# Patient Record
Sex: Female | Born: 1980 | Hispanic: Yes | Marital: Married | Smoking: Never smoker
Health system: Southern US, Community
[De-identification: ages and names within clinical notes are randomized; demographics above are authoritative.]

## PROBLEM LIST (undated history)

## (undated) ENCOUNTER — Inpatient Hospital Stay (HOSPITAL_COMMUNITY): Payer: Self-pay

## (undated) DIAGNOSIS — E119 Type 2 diabetes mellitus without complications: Secondary | ICD-10-CM

## (undated) DIAGNOSIS — Z9049 Acquired absence of other specified parts of digestive tract: Secondary | ICD-10-CM

## (undated) DIAGNOSIS — O24419 Gestational diabetes mellitus in pregnancy, unspecified control: Secondary | ICD-10-CM

## (undated) DIAGNOSIS — N83209 Unspecified ovarian cyst, unspecified side: Secondary | ICD-10-CM

## (undated) DIAGNOSIS — D649 Anemia, unspecified: Secondary | ICD-10-CM

## (undated) HISTORY — PX: CHOLECYSTECTOMY: SHX55

## (undated) HISTORY — DX: Type 2 diabetes mellitus without complications: E11.9

## (undated) HISTORY — DX: Acquired absence of other specified parts of digestive tract: Z90.49

## (undated) HISTORY — DX: Unspecified ovarian cyst, unspecified side: N83.209

## (undated) HISTORY — DX: Anemia, unspecified: D64.9

---

## 2013-08-08 DIAGNOSIS — Z9049 Acquired absence of other specified parts of digestive tract: Secondary | ICD-10-CM

## 2013-08-08 HISTORY — DX: Acquired absence of other specified parts of digestive tract: Z90.49

## 2013-08-08 HISTORY — PX: OVARIAN CYST REMOVAL: SHX89

## 2014-10-08 LAB — CYTOLOGY - PAP: PAP SMEAR: NEGATIVE

## 2016-08-08 NOTE — L&D Delivery Note (Signed)
Patient is 36 y.o. [id number] [redacted]w[redacted]d admitted IOL GDMA2 on glyburide. S/p IOL with cytotec, followed by Pitocin. AROM at 12:30.  Prenatal course also complicated by pelvis proven to 9.5#.  Delivery Note At 5:59 PM a viable female was delivered via Vaginal, Spontaneous Delivery (Presentation: ROA).  APGAR: 9/9; weight pending.   Placenta status: intact with 3 vessels.  Cord: loose body easily reduced.  Cord pH: N/A.  Anesthesia:  Epidural Episiotomy:  N/A Lacerations:  Superficial B/L periurethral lacerations, hemostatic Est. Blood Loss (mL):  200  Mom to postpartum.  Baby to Couplet care / Skin to Skin.  Upon arrival patient was complete and pushing. She pushed with good maternal effort to deliver a viable female infant in cephalic, ROA position. Loose body nuchal cord, easily reduced. Baby delivered without difficulty (anterior shoulder delivered with ease), was noted to have good tone and place on maternal abdomen for oral suctioning, drying and stimulation. Delayed cord clamping performed. Placenta delivered spontaneously with gentle cord traction. Fundus firm with massage and Pitocin. Perineum inspected and found to have superficial B/L periurethral laceration, which were found to be hemostatic and not requiring repair. Counts of sharps, instruments, and lap pads were all correct.  Zerita Boers, CNM, supervised delivery and repair.  Burna Cash, MD Family Medicine Resident, PGY-3 04/29/2017 6:30 PM

## 2016-10-24 LAB — OB RESULTS CONSOLE PLATELET COUNT: PLATELETS: 302

## 2016-10-24 LAB — OB RESULTS CONSOLE RUBELLA ANTIBODY, IGM: Rubella: IMMUNE

## 2016-10-24 LAB — OB RESULTS CONSOLE HGB/HCT, BLOOD
HCT: 39
HCT: 39
HEMOGLOBIN: 13.2

## 2016-10-24 LAB — OB RESULTS CONSOLE GC/CHLAMYDIA
CHLAMYDIA, DNA PROBE: NEGATIVE
Gonorrhea: NEGATIVE

## 2016-10-24 LAB — OB RESULTS CONSOLE HIV ANTIBODY (ROUTINE TESTING): HIV: NONREACTIVE

## 2016-10-24 LAB — OB RESULTS CONSOLE RPR: RPR: NONREACTIVE

## 2016-10-24 LAB — OB RESULTS CONSOLE HEPATITIS B SURFACE ANTIGEN: HEP B S AG: NEGATIVE

## 2016-10-24 LAB — OB RESULTS CONSOLE ABO/RH

## 2016-10-24 LAB — OB RESULTS CONSOLE VARICELLA ZOSTER ANTIBODY, IGG: VARICELLA IGG: IMMUNE

## 2016-10-24 LAB — OB RESULTS CONSOLE ANTIBODY SCREEN: Antibody Screen: POSITIVE

## 2016-10-24 LAB — GLUCOSE, 1 HOUR: GLUCOSE 1 HOUR GTT: 97 (ref ?–200)

## 2017-02-16 ENCOUNTER — Ambulatory Visit: Payer: Self-pay | Admitting: *Deleted

## 2017-02-16 ENCOUNTER — Encounter: Payer: Self-pay | Attending: Obstetrics and Gynecology | Admitting: *Deleted

## 2017-02-16 ENCOUNTER — Other Ambulatory Visit: Payer: Self-pay

## 2017-02-16 DIAGNOSIS — O24410 Gestational diabetes mellitus in pregnancy, diet controlled: Secondary | ICD-10-CM

## 2017-02-16 DIAGNOSIS — Z3A00 Weeks of gestation of pregnancy not specified: Secondary | ICD-10-CM | POA: Insufficient documentation

## 2017-02-16 NOTE — Progress Notes (Signed)
  Patient was seen on 02/16/2017 for Gestational Diabetes self-management.  Patient here with Spanish Interpretor. She states no history of GDM with previous pregnancies. She says her husband has Diabetes though. Diet history obtained.  The following learning objectives were met by the patient :   States the definition of Gestational Diabetes  States why dietary management is important in controlling blood glucose  Describes the effects of carbohydrates on blood glucose levels  Demonstrates ability to create a balanced meal plan  Demonstrates carbohydrate counting   States when to check blood glucose levels  Demonstrates proper blood glucose monitoring techniques  States the effect of stress and exercise on blood glucose levels  States the importance of limiting caffeine and abstaining from alcohol and smoking  Plan:  Aim for 3 Carb Choices per meal (45 grams) +/- 1 either way  Aim for 1-2 Carbs per snack Begin reading food labels for Total Carbohydrate of foods Consider  increasing your activity level by walking or other activity daily as tolerated Begin checking BG before breakfast and 2 hours after first bite of breakfast, lunch and dinner as directed by MD  Bring Log Book to every medical appointment   Take medication if directed by MD  Blood glucose monitor given: [employer] Lot # [id number] Exp: 11/26/2018 Blood glucose reading: 143 mg/dl after eating 2 Pop Tarts on way to this appointment  Patient instructed to monitor glucose levels: FBS: 60 - 95 mg/dl 2 hour: <120 mg/dl  Patient received the following handouts:  Nutrition Diabetes and Pregnancy  Carbohydrate Counting List  BG Log Sheet  Patient will be seen for follow-up as needed.

## 2017-02-20 ENCOUNTER — Encounter: Payer: Self-pay | Admitting: Obstetrics and Gynecology

## 2017-03-13 ENCOUNTER — Ambulatory Visit (INDEPENDENT_AMBULATORY_CARE_PROVIDER_SITE_OTHER): Payer: Self-pay | Admitting: Obstetrics & Gynecology

## 2017-03-13 ENCOUNTER — Encounter: Payer: Self-pay | Admitting: Obstetrics & Gynecology

## 2017-03-13 ENCOUNTER — Encounter: Payer: Self-pay | Admitting: *Deleted

## 2017-03-13 ENCOUNTER — Ambulatory Visit: Payer: Self-pay

## 2017-03-13 DIAGNOSIS — O24419 Gestational diabetes mellitus in pregnancy, unspecified control: Secondary | ICD-10-CM

## 2017-03-13 DIAGNOSIS — O0993 Supervision of high risk pregnancy, unspecified, third trimester: Secondary | ICD-10-CM

## 2017-03-13 DIAGNOSIS — O0990 Supervision of high risk pregnancy, unspecified, unspecified trimester: Secondary | ICD-10-CM | POA: Insufficient documentation

## 2017-03-13 LAB — POCT URINALYSIS DIP (DEVICE)
BILIRUBIN URINE: NEGATIVE
Glucose, UA: NEGATIVE mg/dL
Hgb urine dipstick: NEGATIVE
KETONES UR: NEGATIVE mg/dL
LEUKOCYTES UA: NEGATIVE
NITRITE: NEGATIVE
Protein, ur: NEGATIVE mg/dL
Specific Gravity, Urine: 1.015 (ref 1.005–1.030)
Urobilinogen, UA: 0.2 mg/dL (ref 0.0–1.0)
pH: 7 (ref 5.0–8.0)

## 2017-03-13 LAB — FETAL NONSTRESS TEST

## 2017-03-13 NOTE — Patient Instructions (Signed)
Diagnstico de diabetes mellitus gestacional  (Gestational Diabetes Mellitus, Diagnosis)  La diabetes gestacional (diabetes mellitus gestacional) es una forma de diabetes a corto plazo (temporal) que puede aparecer durante el embarazo. Este cuadro desaparece despus del parto. Puede deberse a uno de estos problemas o a ambos:   El cuerpo no produce la cantidad suficiente de una hormona llamada insulina.   El cuerpo no responde de forma normal a la insulina que produce.  La insulina permite que los ciertos azcares (glucosa) ingresen a las clulas del cuerpo. Esto le proporciona la energa. Cuando se tiene diabetes, la glucosa no puede ingresar a las clulas. Esto produce un aumento del nivel de glucosa en la sangre (hiperglucemia).  Si la diabetes se trata, es posible que ni usted ni el beb se vean afectados. El mdico fijar los objetivos del tratamiento para usted. Generalmente, los resultados de la glucemia deben ser los siguientes:   Despus de no comer durante mucho tiempo (ayunar): 95mg/dl (5,3mmol/l).   Despus de las comidas (posprandial):  ? Una hora despus de una comida: igual o menor que 140mg/dl (7,8mmol/l).  ? Dos horas despus de una comida: igual o menor que 120mg/dl (6,7mmol/l).   Nivel de A1c (hemoglobinaA1c): del 6% al 6,5%.  CUIDADOS EN EL HOGAR  Preguntas para hacerle al mdico  Puede hacer las siguientes preguntas:   Debo reunirme con un instructor para el cuidado de la diabetes?   Dnde puedo encontrar un grupo de apoyo para personas diabticas?   Qu equipos necesitar para cuidarme en casa?   Qu medicamentos para la diabetes necesito? Cundo debo tomarlos?   Con qu frecuencia debo controlarme el nivel de glucosa en la sangre?   A qu nmero puedo llamar si tengo preguntas?   Cundo es la prxima cita con el mdico?  Instrucciones generales   Tome los medicamentos de venta libre y los recetados solamente como se lo haya indicado el mdico.   Mantenga un peso  saludable durante el embarazo.   Concurra a todas las visitas de control como se lo haya indicado el mdico. Esto es importante.  SOLICITE AYUDA SI:   Su nivel de glucosa en la sangre es igual o superior a 240mg/dl (13,3mmol/dl).   Su nivel de glucosa en la sangre es igual o superior a 200mg/dl (11,1mmol/l), y tiene cetonas en la orina.   Ha estado enferma o ha tenido fiebre durante 2o ms das y no mejora.   Si tiene alguno de estos problemas durante ms de 6horas:  ? No puede comer ni beber.  ? Siente malestar estomacal (nuseas).  ? Vomita.  ? La materia fecal es lquida (diarrea).  SOLICITE AYUDA DE INMEDIATO SI:   El nivel de glucosa en la sangre est por debajo de 54mg/dl (3mmol/l).   Est confundida.   Tiene dificultad para hacer lo siguiente:  ? Pensar con claridad.  ? Respirar.   El beb se mueve menos de lo normal.   Tiene los siguientes sntomas:  ? Niveles moderados o altos de cetonas en la orina.  ? Hemorragia vaginal.  ? Secrecin de un lquido fuera de lo comn de la vagina.  ? Contracciones prematuras. Estas pueden causar una sensacin de opresin en el vientre.  Esta informacin no tiene como fin reemplazar el consejo del mdico. Asegrese de hacerle al mdico cualquier pregunta que tenga.  Document Released: 11/16/2015 Document Revised: 11/16/2015 Document Reviewed: 08/28/2015  Elsevier Interactive Patient Education  2018 [employer]

## 2017-03-13 NOTE — Progress Notes (Signed)
  [facility]    Yvonne Adams is a [id number] 5955w2d being seen today for her first obstetrical visit.  Her obstetrical history is significant for gestational diabetes. Patient does intend to breast feed. Pregnancy history fully reviewed.  Patient reports no complaints.  Vitals:   03/13/17 0903 03/13/17 0905  BP: 113/67   Pulse: 78   Weight: 201 lb 14.4 oz (91.6 kg)   Height:  5\' 3"  (1.6 m)    HISTORY: OB History  Gravida Para Term Preterm AB Living  7 3 3   3 3   SAB TAB Ectopic Multiple Live Births  3       3    # Outcome Date GA Lbr Len/2nd Weight Sex Delivery Anes PTL Lv  7 Current           6 Term 07/25/11 7021w0d   F  EPI N LIV  5 SAB 02/10/09        FD  4 SAB 06/13/06          3 SAB 03/13/04          2 Term 12/25/00 3021w0d   F  EPI N LIV  1 Term 07/22/99 8221w0d   F Vag-Spont Local N LIV     Past Medical History:  Diagnosis Date  . Diabetes mellitus without complication (HCC)    No past surgical history on file. No family history on file.   Exam    Uterus:     Pelvic Exam:                                    Skin: normal coloration and turgor, no rashes    Neurologic: oriented, normal mood   Extremities: normal strength, tone, and muscle mass   HEENT PERRLA and thyroid without masses   Mouth/Teeth dental hygiene good   Neck supple   Cardiovascular: regular rate and rhythm   Respiratory:  appears well, vitals normal, no respiratory distress, acyanotic, normal RR   Abdomen: gravid 32 cm FH          Assessment:    Pregnancy: [id number] Patient Active Problem List   Diagnosis Date Noted  . Supervision of high risk pregnancy, antepartum 03/13/2017  . Gestational diabetes mellitus (GDM), antepartum 03/13/2017        Plan:    FBS >100 and PP up to 140-190. Reports weight loss Initial labs drawn. Prenatal vitamins. Problem list reviewed and updated. HD records pending  Ultrasound discussed; fetal  survey: ordered.  Follow up in 1 weeks. 50% of 30 min visit spent on counseling and coordination of care.  Needs f/u with diabetes nurse or dietician   Yvonne Adams 03/13/2017

## 2017-03-16 ENCOUNTER — Encounter: Payer: Self-pay | Admitting: *Deleted

## 2017-03-21 ENCOUNTER — Other Ambulatory Visit: Payer: Self-pay | Admitting: *Deleted

## 2017-03-21 DIAGNOSIS — O24419 Gestational diabetes mellitus in pregnancy, unspecified control: Secondary | ICD-10-CM

## 2017-03-23 ENCOUNTER — Encounter (HOSPITAL_COMMUNITY): Payer: Self-pay

## 2017-03-23 ENCOUNTER — Ambulatory Visit (INDEPENDENT_AMBULATORY_CARE_PROVIDER_SITE_OTHER): Payer: Self-pay | Admitting: Obstetrics & Gynecology

## 2017-03-23 ENCOUNTER — Encounter: Payer: Self-pay | Attending: Obstetrics and Gynecology | Admitting: *Deleted

## 2017-03-23 ENCOUNTER — Ambulatory Visit (HOSPITAL_COMMUNITY): Payer: Self-pay

## 2017-03-23 ENCOUNTER — Ambulatory Visit (HOSPITAL_COMMUNITY)
Admission: RE | Admit: 2017-03-23 | Discharge: 2017-03-23 | Disposition: A | Discharge status: Home / Self Care | Payer: Self-pay | Source: Ambulatory Visit | Attending: Obstetrics & Gynecology | Admitting: Obstetrics & Gynecology

## 2017-03-23 ENCOUNTER — Ambulatory Visit (INDEPENDENT_AMBULATORY_CARE_PROVIDER_SITE_OTHER): Payer: Self-pay | Admitting: *Deleted

## 2017-03-23 ENCOUNTER — Other Ambulatory Visit (HOSPITAL_COMMUNITY): Payer: Self-pay | Admitting: *Deleted

## 2017-03-23 ENCOUNTER — Ambulatory Visit: Payer: Self-pay | Admitting: *Deleted

## 2017-03-23 VITALS — BP 125/63 | HR 83 | Wt 202.1 lb

## 2017-03-23 DIAGNOSIS — O0990 Supervision of high risk pregnancy, unspecified, unspecified trimester: Secondary | ICD-10-CM

## 2017-03-23 DIAGNOSIS — O24410 Gestational diabetes mellitus in pregnancy, diet controlled: Secondary | ICD-10-CM

## 2017-03-23 DIAGNOSIS — O24419 Gestational diabetes mellitus in pregnancy, unspecified control: Secondary | ICD-10-CM

## 2017-03-23 DIAGNOSIS — Z3A00 Weeks of gestation of pregnancy not specified: Secondary | ICD-10-CM | POA: Insufficient documentation

## 2017-03-23 DIAGNOSIS — Z3A33 33 weeks gestation of pregnancy: Secondary | ICD-10-CM | POA: Insufficient documentation

## 2017-03-23 DIAGNOSIS — Z363 Encounter for antenatal screening for malformations: Secondary | ICD-10-CM | POA: Insufficient documentation

## 2017-03-23 DIAGNOSIS — O0993 Supervision of high risk pregnancy, unspecified, third trimester: Secondary | ICD-10-CM

## 2017-03-23 HISTORY — DX: Gestational diabetes mellitus in pregnancy, unspecified control: O24.419

## 2017-03-23 LAB — POCT URINALYSIS DIP (DEVICE)
BILIRUBIN URINE: NEGATIVE
Glucose, UA: NEGATIVE mg/dL
KETONES UR: NEGATIVE mg/dL
Leukocytes, UA: NEGATIVE
Nitrite: NEGATIVE
PH: 7 (ref 5.0–8.0)
PROTEIN: NEGATIVE mg/dL
SPECIFIC GRAVITY, URINE: 1.02 (ref 1.005–1.030)
Urobilinogen, UA: 0.2 mg/dL (ref 0.0–1.0)

## 2017-03-23 MED ORDER — FLUCONAZOLE 150 MG PO TABS: 150.0000 mg | ORAL_TABLET | Freq: Once | ORAL | 0 refills | Status: AC

## 2017-03-23 MED ORDER — GLYBURIDE 2.5 MG PO TABS: 2.5000 mg | ORAL_TABLET | Freq: Every day | ORAL | 3 refills | Status: DC

## 2017-03-23 MED ORDER — PRENATAL VITAMINS 0.8 MG PO TABS: 1.0000 | ORAL_TABLET | Freq: Every day | ORAL | 12 refills | Status: AC

## 2017-03-23 NOTE — Patient Instructions (Signed)
Diagnstico de diabetes mellitus gestacional  (Gestational Diabetes Mellitus, Diagnosis)  La diabetes gestacional (diabetes mellitus gestacional) es una forma de diabetes a corto plazo (temporal) que puede aparecer durante el embarazo. Este cuadro desaparece despus del parto. Puede deberse a uno de estos problemas o a ambos:   El cuerpo no produce la cantidad suficiente de una hormona llamada insulina.   El cuerpo no responde de forma normal a la insulina que produce.  La insulina permite que los ciertos azcares (glucosa) ingresen a las clulas del cuerpo. Esto le proporciona la energa. Cuando se tiene diabetes, la glucosa no puede ingresar a las clulas. Esto produce un aumento del nivel de glucosa en la sangre (hiperglucemia).  Si la diabetes se trata, es posible que ni usted ni el beb se vean afectados. El mdico fijar los objetivos del tratamiento para usted. Generalmente, los resultados de la glucemia deben ser los siguientes:   Despus de no comer durante mucho tiempo (ayunar): 95mg/dl (5,3mmol/l).   Despus de las comidas (posprandial):  ? Una hora despus de una comida: igual o menor que 140mg/dl (7,8mmol/l).  ? Dos horas despus de una comida: igual o menor que 120mg/dl (6,7mmol/l).   Nivel de A1c (hemoglobinaA1c): del 6% al 6,5%.  CUIDADOS EN EL HOGAR  Preguntas para hacerle al mdico  Puede hacer las siguientes preguntas:   Debo reunirme con un instructor para el cuidado de la diabetes?   Dnde puedo encontrar un grupo de apoyo para personas diabticas?   Qu equipos necesitar para cuidarme en casa?   Qu medicamentos para la diabetes necesito? Cundo debo tomarlos?   Con qu frecuencia debo controlarme el nivel de glucosa en la sangre?   A qu nmero puedo llamar si tengo preguntas?   Cundo es la prxima cita con el mdico?  Instrucciones generales   Tome los medicamentos de venta libre y los recetados solamente como se lo haya indicado el mdico.   Mantenga un peso  saludable durante el embarazo.   Concurra a todas las visitas de control como se lo haya indicado el mdico. Esto es importante.  SOLICITE AYUDA SI:   Su nivel de glucosa en la sangre es igual o superior a 240mg/dl (13,3mmol/dl).   Su nivel de glucosa en la sangre es igual o superior a 200mg/dl (11,1mmol/l), y tiene cetonas en la orina.   Ha estado enferma o ha tenido fiebre durante 2o ms das y no mejora.   Si tiene alguno de estos problemas durante ms de 6horas:  ? No puede comer ni beber.  ? Siente malestar estomacal (nuseas).  ? Vomita.  ? La materia fecal es lquida (diarrea).  SOLICITE AYUDA DE INMEDIATO SI:   El nivel de glucosa en la sangre est por debajo de 54mg/dl (3mmol/l).   Est confundida.   Tiene dificultad para hacer lo siguiente:  ? Pensar con claridad.  ? Respirar.   El beb se mueve menos de lo normal.   Tiene los siguientes sntomas:  ? Niveles moderados o altos de cetonas en la orina.  ? Hemorragia vaginal.  ? Secrecin de un lquido fuera de lo comn de la vagina.  ? Contracciones prematuras. Estas pueden causar una sensacin de opresin en el vientre.  Esta informacin no tiene como fin reemplazar el consejo del mdico. Asegrese de hacerle al mdico cualquier pregunta que tenga.  Document Released: 11/16/2015 Document Revised: 11/16/2015 Document Reviewed: 08/28/2015  Elsevier Interactive Patient Education  2018 [employer]

## 2017-03-23 NOTE — Progress Notes (Signed)
Pt c/o vulvar itching Spanish interpreter Lorinda Creedaquel Mora present for visit

## 2017-03-23 NOTE — Progress Notes (Signed)
  Patient was seen on 03/23/2017 for Gestational Diabetes self-management follow up visit.  Patient here with Spanish Interpretor. She brought her BG Log Sheet as requested. FBG appear within target ranges most of the time with range of 86-104 noted. Post breakfast and lunch are within target ranges typically as well unless she reports drinking a lemonade or eating pizza. Her post supper BG's are running higher with range of 110-150 mg/dl. She states she is not hungry in the evening and is just eating fruit at that meal. The following information was discussed today:   Review of food groups that contain carbohydrate and portion sizes  Encouraged protein choices at each meal and snack  Reviewed examples of protein options including meat, chicken, eggs, cheese, nuts and peanut butter  Reviewed reading of Food Label and how to determine Total Carbohydrate of the Serving Size  Plan:  Aim for 3 Carb Choices per meal (45 grams) +/- 1 either way  Aim for 1-2 Carbs per snack Begin reading food labels for Total Carbohydrate of foods Include protein with all meals and snacks, especially adding to evening meal now Consider  increasing your activity level by walking or other activity daily as tolerated Continue checking BG before breakfast and 2 hours after first bite of breakfast, lunch and dinner as directed by MD  Bring Log Book to every medical appointment   Take medication if directed by MD  Patient instructed to monitor glucose levels: FBS: 60 - 95 mg/dl 2 hour: <191<120 mg/dl  Patient received the following handouts: in Spanish  Carbohydrate Counting List green card  Food Label handoug  Patient will be seen for follow-up as needed.

## 2017-03-23 NOTE — Progress Notes (Signed)
US for growth and BPP done today 

## 2017-03-29 ENCOUNTER — Encounter: Payer: Self-pay | Admitting: Family Medicine

## 2017-03-29 ENCOUNTER — Other Ambulatory Visit: Payer: Self-pay

## 2017-04-05 ENCOUNTER — Ambulatory Visit: Payer: Self-pay

## 2017-04-05 ENCOUNTER — Ambulatory Visit (INDEPENDENT_AMBULATORY_CARE_PROVIDER_SITE_OTHER): Payer: Self-pay | Admitting: *Deleted

## 2017-04-05 ENCOUNTER — Ambulatory Visit (INDEPENDENT_AMBULATORY_CARE_PROVIDER_SITE_OTHER): Payer: Self-pay | Admitting: Obstetrics & Gynecology

## 2017-04-05 VITALS — BP 122/67 | HR 79 | Wt 206.2 lb

## 2017-04-05 DIAGNOSIS — O0990 Supervision of high risk pregnancy, unspecified, unspecified trimester: Secondary | ICD-10-CM

## 2017-04-05 DIAGNOSIS — O24415 Gestational diabetes mellitus in pregnancy, controlled by oral hypoglycemic drugs: Secondary | ICD-10-CM

## 2017-04-05 LAB — POCT URINALYSIS DIP (DEVICE)
Bilirubin Urine: NEGATIVE
Glucose, UA: NEGATIVE mg/dL
HGB URINE DIPSTICK: NEGATIVE
KETONES UR: NEGATIVE mg/dL
LEUKOCYTES UA: NEGATIVE
Nitrite: NEGATIVE
PROTEIN: NEGATIVE mg/dL
SPECIFIC GRAVITY, URINE: 1.02 (ref 1.005–1.030)
UROBILINOGEN UA: 0.2 mg/dL (ref 0.0–1.0)
pH: 7 (ref 5.0–8.0)

## 2017-04-05 NOTE — Progress Notes (Signed)
   PRENATAL VISIT NOTE  Subjective:  Yvonne Adams is a 36 y.o. [phone] at 5660w4d being seen today for ongoing prenatal care. Patient is Spanish-speaking only, Spanish interpreter present for this encounter.  She is currently monitored for the following issues for this high-risk pregnancy and has Supervision of high risk pregnancy, antepartum and Gestational diabetes mellitus (GDM), antepartum on her problem list.  Patient reports having a couple of episodes of low blood sugar and shaking and sweating; not feeling good.  Contractions: Not present. Vag. Bleeding: None.  Movement: Present. Denies leaking of fluid.   The following portions of the patient's history were reviewed and updated as appropriate: allergies, current medications, past family history, past medical history, past social history, past surgical history and problem list. Problem list updated.  Objective:   Vitals:   04/05/17 1012  BP: 122/67  Pulse: 79  Weight: 206 lb 3.2 oz (93.5 kg)    Fetal Status: Fetal Heart Rate (bpm): NST   Movement: Present     General:  Alert, oriented and cooperative. Patient is in no acute distress.  Skin: Skin is warm and dry. No rash noted.   Cardiovascular: Normal heart rate noted  Respiratory: Normal respiratory effort, no problems with respiration noted  Abdomen: Soft, gravid, appropriate for gestational age.  Pain/Pressure: Absent     Pelvic: Cervical exam deferred        Extremities: Normal range of motion.     Mental Status:  Normal mood and affect. Normal behavior. Normal judgment and thought content.   Assessment and Plan:  Pregnancy: [id number] at 5860w4d  1. Gestational diabetes mellitus (GDM) in third trimester controlled on oral hypoglycemic drug On review of BS, she had some elevated values for fastings and dinner in the beginning of the week. However in the last three days, she had very low numbers and a couple of hypoglycemic episodes.  Concerned about changing  regimen for now, hypoglycemia precautions and interventions reviewed.  Ordered for BPP with scheduled growth scan next week.  BPP 10/10 today, will continue antenatal testing. - US MFM FETAL BPP WO NON STRESS; Future  2. Supervision of high risk pregnancy, antepartum Preterm labor symptoms and general obstetric precautions including but not limited to vaginal bleeding, contractions, leaking of fluid and fetal movement were reviewed in detail with the patient. Please refer to After Visit Summary for other counseling recommendations.  Return in about 7 days (around 04/12/2017) for 9/5 or 9/6 for NST/BPP, Ob fu;  9/13 NST, OB fu - has US @ 1000.   Jaynie CollinsUgonna Anelisse Jacobson, MD

## 2017-04-05 NOTE — Patient Instructions (Signed)
Regrese a la clinica cuando tenga su cita. Si tiene problemas o preguntas, llama a la clinica o vaya a la sala de emerg[facility] al [facility].    

## 2017-04-05 NOTE — Progress Notes (Signed)
US for growth scheduled on 9/13, BPP added.

## 2017-04-05 NOTE — Progress Notes (Signed)
Interpreter Mariel Gallego present for encounter.  Pt informed that the ultrasound is considered a limited OB ultrasound and is not intended to be a complete ultrasound exam.  Patient also informed that the ultrasound is not being completed with the intent of assessing for fetal or placental anomalies or any pelvic abnormalities.  Explained that the purpose of today's ultrasound is to assess for presentation, BPP and amniotic fluid volume.  Patient acknowledges the purpose of the exam and the limitations of the study.    

## 2017-04-13 ENCOUNTER — Ambulatory Visit: Payer: Self-pay

## 2017-04-13 ENCOUNTER — Ambulatory Visit (INDEPENDENT_AMBULATORY_CARE_PROVIDER_SITE_OTHER): Payer: Self-pay | Admitting: Family Medicine

## 2017-04-13 ENCOUNTER — Ambulatory Visit (INDEPENDENT_AMBULATORY_CARE_PROVIDER_SITE_OTHER): Payer: Self-pay | Admitting: *Deleted

## 2017-04-13 VITALS — BP 116/66 | HR 88 | Wt 206.2 lb

## 2017-04-13 DIAGNOSIS — Z331 Pregnant state, incidental: Secondary | ICD-10-CM

## 2017-04-13 DIAGNOSIS — O24415 Gestational diabetes mellitus in pregnancy, controlled by oral hypoglycemic drugs: Secondary | ICD-10-CM

## 2017-04-13 DIAGNOSIS — Z113 Encounter for screening for infections with a predominantly sexual mode of transmission: Secondary | ICD-10-CM

## 2017-04-13 DIAGNOSIS — O329XX0 Maternal care for malpresentation of fetus, unspecified, not applicable or unspecified: Secondary | ICD-10-CM

## 2017-04-13 DIAGNOSIS — O0990 Supervision of high risk pregnancy, unspecified, unspecified trimester: Secondary | ICD-10-CM

## 2017-04-13 LAB — OB RESULTS CONSOLE GC/CHLAMYDIA: GC PROBE AMP, GENITAL: NEGATIVE

## 2017-04-13 NOTE — Progress Notes (Signed)

## 2017-04-13 NOTE — Progress Notes (Signed)
[employer] video interpreter HaskellAntonio [phone]#[id number] used for encounter.  Pt expressed concern for the amount of swelling of her feet. US for growth and BPP scheduled 9/13

## 2017-04-13 NOTE — Addendum Note (Signed)
Addended by: Jill SideAY, Hisae Decoursey L on: 04/13/2017 04:42 PM   Modules accepted: Orders

## 2017-04-13 NOTE — Progress Notes (Signed)
Subjective:  Yvonne PlummerMarlene Guadalupe Adams is a 36 y.o. [phone] [id number] at 3387w5d being seen today for ongoing prenatal care.  She is currently monitored for the following issues for this high-risk pregnancy and has Supervision of high risk pregnancy, antepartum and Gestational diabetes mellitus (GDM), antepartum on her problem list.  GDM: Patient taking glyburide 2.5mg  QHS.  Reports no hypoglycemic episodes.  Tolerating medication well Fasting: 73-120 (3 elevated) 2hr PP: 58-260 (8 elevated, missed 3-4 times)  Patient reports no complaints.  Contractions: Not present. Vag. Bleeding: None.  Movement: Present. Denies leaking of fluid.   The following portions of the patient's history were reviewed and updated as appropriate: allergies, current medications, past family history, past medical history, past social history, past surgical history and problem list. Problem list updated.  Objective:   Vitals:   04/13/17 1508  BP: 116/66  Pulse: 88  Weight: 206 lb 3.2 oz (93.5 kg)    Fetal Status: Fetal Heart Rate (bpm): NST   Movement: Present     General:  Alert, oriented and cooperative. Patient is in no acute distress.  Skin: Skin is warm and dry. No rash noted.   Cardiovascular: Normal heart rate noted  Respiratory: Normal respiratory effort, no problems with respiration noted  Abdomen: Soft, gravid, appropriate for gestational age. Pain/Pressure: Present     Pelvic: Vag. Bleeding: None     Cervical exam deferred        Extremities: Normal range of motion.  Edema: Mild pitting, slight indentation  Mental Status: Normal mood and affect. Normal behavior. Normal judgment and thought content.   Urinalysis:      Assessment and Plan:  Pregnancy: [id number] at 387w5d  1. Gestational diabetes mellitus (GDM) in third trimester controlled on oral hypoglycemic drug BPP 10/10 NST reactive. Growth US next week Glyburide 2.5mg  - increase to BID  2. Supervision of high risk pregnancy, antepartum FHT and  FH normal  3. Malposition of baby Scheduled for version  Term labor symptoms and general obstetric precautions including but not limited to vaginal bleeding, contractions, leaking of fluid and fetal movement were reviewed in detail with the patient. Please refer to After Visit Summary for other counseling recommendations.  Return in about 7 days (around 04/20/2017) for as scheduled.   Levie HeritageStinson, Cannon Arreola J, DO

## 2017-04-14 LAB — GC/CHLAMYDIA PROBE AMP (~~LOC~~) NOT AT ARMC
Chlamydia: NEGATIVE
Neisseria Gonorrhea: NEGATIVE

## 2017-04-15 LAB — STREP GP B NAA: STREP GROUP B AG: NEGATIVE

## 2017-04-17 ENCOUNTER — Encounter: Payer: Self-pay | Admitting: Lab

## 2017-04-17 ENCOUNTER — Encounter: Payer: Self-pay | Admitting: *Deleted

## 2017-04-17 ENCOUNTER — Telehealth (HOSPITAL_COMMUNITY): Payer: Self-pay | Admitting: *Deleted

## 2017-04-17 NOTE — Telephone Encounter (Signed)
Preadmission screen Interpreter number [id number] 

## 2017-04-18 ENCOUNTER — Observation Stay (HOSPITAL_COMMUNITY)
Admission: RE | Admit: 2017-04-18 | Discharge: 2017-04-18 | Disposition: A | Discharge status: Home / Self Care | Payer: Self-pay | Source: Ambulatory Visit | Attending: Family Medicine | Admitting: Family Medicine

## 2017-04-18 ENCOUNTER — Encounter (HOSPITAL_COMMUNITY): Payer: Self-pay

## 2017-04-18 DIAGNOSIS — O321XX0 Maternal care for breech presentation, not applicable or unspecified: Secondary | ICD-10-CM

## 2017-04-18 DIAGNOSIS — O24415 Gestational diabetes mellitus in pregnancy, controlled by oral hypoglycemic drugs: Secondary | ICD-10-CM | POA: Insufficient documentation

## 2017-04-18 DIAGNOSIS — Z7984 Long term (current) use of oral hypoglycemic drugs: Secondary | ICD-10-CM | POA: Insufficient documentation

## 2017-04-18 DIAGNOSIS — Z3A33 33 weeks gestation of pregnancy: Secondary | ICD-10-CM | POA: Insufficient documentation

## 2017-04-18 MED ORDER — LACTATED RINGERS IV SOLN
INTRAVENOUS | Status: DC
  Administered 2017-04-18: 10:00:00 via INTRAVENOUS

## 2017-04-18 MED ORDER — TERBUTALINE SULFATE 1 MG/ML IJ SOLN
0.2500 mg | Freq: Once | INTRAMUSCULAR | Status: AC
  Administered 2017-04-18: 0.25 mg via SUBCUTANEOUS
  Filled 2017-04-18: qty 1

## 2017-04-18 NOTE — H&P (Signed)
Preprocedural History and Physical  Yvonne Adams is a 36 y.o. [id number] here for management of malposition. Baby in breech position with fetal head in RUQ. No significant preoperative concerns.  Proposed procedure: External cephalic version  Past Medical History:  Diagnosis Date  . Anemia   . Diabetes mellitus without complication (HCC)   . Gestational diabetes   . Ovarian cyst   . S/P cholecystectomy 2015   Past Surgical History:  Procedure Laterality Date  . CHOLECYSTECTOMY    . OVARIAN CYST REMOVAL Right 2015   OB History    Gravida Para Term Preterm AB Living   7 3 3   3 3    SAB TAB Ectopic Multiple Live Births   3       3     Patient denies any cervical dysplasia or STIs. No current facility-administered medications on file prior to encounter.    Current Outpatient Prescriptions on File Prior to Encounter  Medication Sig Dispense Refill  . acetaminophen (TYLENOL) 500 MG tablet Take 500 mg by mouth every 6 (six) hours as needed.    . glyBURIDE (DIABETA) 2.5 MG tablet Take 1 tablet (2.5 mg total) by mouth at bedtime. 60 tablet 3  . Prenatal Multivit-Min-Fe-FA (PRENATAL VITAMINS) 0.8 MG tablet Take 1 tablet by mouth daily. 30 tablet 12   No Known Allergies Social History:   reports that she has never smoked. She has never used smokeless tobacco. She reports that she does not drink alcohol or use drugs.  History reviewed. No pertinent family history.  Review of Systems: Full 10 systems review of systems preformed, which were normal other than what was stated in the HPI.  PHYSICAL EXAM: Height 5\' 3"  (1.6 m), weight 206 lb (93.4 kg), last menstrual period 07/30/2016. General appearance - alert, well appearing, and in no distress Head - Normocephalic, atraumatic.  Right and left external ears normal. Eyes - EOMI.  Nonicteric.  Normal conjunctiva Neck - supple, no lymphadenopathy.  No tracheal deviation Chest - clear to auscultation, no wheezes, rales  or rhonchi, symmetric air entry Heart - normal rate and regular rhythm Abdomen - soft, nontender, nondistended, no masses or organomegaly Pelvic - examination not indicated. Fundus at term. Baby in breech position. Extremities - peripheral pulses normal, no pedal edema, no clubbing or cyanosis Skin - Warm to touch. no bruises, rashes, wounds. Neuro - Oriented x3.  Cranial nerves intact. Psych - normal thought process.  Judgement intact.  FHT: category 1  Labs: Results for orders placed or performed in visit on 04/17/17 (from the past 336 hour(s))  OB RESULTS CONSOLE GC/Chlamydia   Collection Time: 04/13/17 12:00 AM  Result Value Ref Range   Gonorrhea Negative   Results for orders placed or performed in visit on 04/13/17 (from the past 336 hour(s))  GC/Chlamydia probe amp ([facility])not at [facility]   Collection Time: 04/13/17 12:00 AM  Result Value Ref Range   Chlamydia Negative    Neisseria gonorrhea Negative   Strep Gp B NAA   Collection Time: 04/13/17  4:39 PM  Result Value Ref Range   Strep Gp B NAA Negative Negative  Results for orders placed or performed in visit on 04/05/17 (from the past 336 hour(s))  POCT urinalysis dip (device)   Collection Time: 04/05/17 10:08 AM  Result Value Ref Range   Glucose, UA NEGATIVE NEGATIVE mg/dL   Bilirubin Urine NEGATIVE NEGATIVE   Ketones, ur NEGATIVE NEGATIVE mg/dL   Specific Gravity, Urine 1.020 1.005 - 1.030  Hgb urine dipstick NEGATIVE NEGATIVE   pH 7.0 5.0 - 8.0   Protein, ur NEGATIVE NEGATIVE mg/dL   Urobilinogen, UA 0.2 0.0 - 1.0 mg/dL   Nitrite NEGATIVE NEGATIVE   Leukocytes, UA NEGATIVE NEGATIVE    Imaging Studies: [place] Mfm Fetal Bpp Wo Non Stress  Result Date: 03/23/2017 ----------------------------------------------------------------------  OBSTETRICS REPORT                        (Corrected Final 03/23/2017 11:58                                                                          am)  ---------------------------------------------------------------------- Patient Info  ID #:       [id number]                          D.O.B.:  11-03-1980 (36 yrs)  Name:       Yvonne Adams                   Visit Date: 03/23/2017 08:42 am              Yvonne Adams ---------------------------------------------------------------------- Performed By  Performed By:     Tommi Emery         Ref. Address:     [address]                                                             [place], [place]                                                             [address]  Attending:        Charlsie Merles MD         Location:         [facility]  Referred By:      Adam Phenix                    MD ---------------------------------------------------------------------- Orders   #  Description                                 Code   1  [place] MFM OB DETAIL +14 WK  60454.09   2  [place] MFM FETAL BPP WO NON STRESS              76819.01  ----------------------------------------------------------------------   #  Ordered By               Order #        Accession #    Episode #   1  Scheryl Darter             [id number]      [id number]     [id number]   2  Scheryl Darter             [id number]      [id number]     [id number]  ---------------------------------------------------------------------- Indications   [redacted] weeks gestation of pregnancy                Z3A.33   Encounter for antenatal screening for          Z36.3   malformations   Gestational diabetes in pregnancy, diet        O24.410   controlled  ---------------------------------------------------------------------- OB History  Blood Type:            Height:  5'3"   Weight (lb):  180       BMI:                                                       lb  Gravidity:    7         Term:   3        Prem:   0        SAB:   3  Living:       3  ---------------------------------------------------------------------- Fetal Evaluation  Num Of Fetuses:     1  Fetal Heart         134  Rate(bpm):  Cardiac Activity:   Observed  Presentation:       Breech  Placenta:           Anterior, above cervical os  P. Cord Insertion:  Visualized  Amniotic Fluid  AFI FV:      Subjectively within normal limits  AFI Sum(cm)     %Tile       Largest Pocket(cm)  18.11           67          5.83  RUQ(cm)       RLQ(cm)       LUQ(cm)        LLQ(cm)  4.63          2.46          5.83           5.19 ---------------------------------------------------------------------- Biophysical Evaluation  Amniotic F.V:   Within normal limits       F. Tone:        Observed  F. Movement:    Observed                   Score:          8/8  F. Breathing:   Observed ---------------------------------------------------------------------- Biometry  BPD:      81.7  mm     G. Age:  32w 6d         22  %  CI:        71.53   %    70 - 86                                                          FL/HC:      21.0   %    19.4 - 21.8  HC:      307.6  mm     G. Age:  34w 2d         28  %    HC/AC:      1.01        0.96 - 1.11  AC:      303.9  mm     G. Age:  34w 3d         71  %    FL/BPD:     79.1   %    71 - 87  FL:       64.6  mm     G. Age:  33w 2d         30  %    FL/AC:      21.3   %    20 - 24  CER:        45  mm     G. Age:  39w 0d       > 95  %  Est. FW:    2305  gm      5 lb 1 oz     63  % ---------------------------------------------------------------------- Gestational Age  LMP:           33w 5d        Date:  07/30/16                 EDD:   05/06/17  U/S Today:     33w 5d                                        EDD:   05/06/17  Best:          33w 5d     Det. By:  LMP  (07/30/16)          EDD:   05/06/17 ---------------------------------------------------------------------- Anatomy  Cranium:               Appears normal         Aortic Arch:            Appears normal  Cavum:                 Appears normal          Ductal Arch:            Appears normal  Ventricles:            Appears normal         Diaphragm:              Appears normal  Choroid Plexus:        Not well visualized    Stomach:                Appears normal, left  sided  Cerebellum:            Appears normal         Abdomen:                Appears normal  Posterior Fossa:       Appears normal         Abdominal Wall:         Not well visualized  Nuchal Fold:           Not well visualized    Cord Vessels:           Appears normal (3                                                                        vessel cord)  Face:                  Appears normal         Kidneys:                Appear normal                         (orbits and profile)  Lips:                  Appears normal         Bladder:                Appears normal  Thoracic:              Appears normal         Spine:                  Not well visualized  Heart:                 Appears normal         Upper Extremities:      Not well visualized                         (4CH, axis, and situs  RVOT:                  Appears normal         Lower Extremities:      Visualized  LVOT:                  Appears normal  Other:  Gender not well visualized. ---------------------------------------------------------------------- Cervix Uterus Adnexa  Cervix  Not visualized (advanced GA >29wks)  Uterus  No abnormality visualized.  Left Ovary  Not visualized. No adnexal mass visualized.  Right Ovary  Not visualized. No adnexal mass visualized. ---------------------------------------------------------------------- Impression  Singleton intrauterine pregnancy at 33+5 weeks with GDM,  here for inital evaluation  Normal fetal movement and cardiac activity  Review of the anatomy shows no sonographic markers for  aneuploidy or structural anomalies  However, evaluation is suboptimal secondary to late EGA  and fetal position  Amniotic fluid volume is  normal with an AFI of 18.1 cm  Estimated fetal weight is 2305g which is growth in the 63rd  percentile  BPP 8/8 ---------------------------------------------------------------------- Recommendations  Repeat scan for growth in 4 weeks ----------------------------------------------------------------------                      Charlsie Merles, MD Electronically Signed Corrected Final Report  03/23/2017 11:58 am ----------------------------------------------------------------------  [place] Mfm Ob Detail +14 Wk  Result Date: 03/23/2017 ----------------------------------------------------------------------  OBSTETRICS REPORT                        (Corrected Final 03/23/2017 11:58                                                                          am) ---------------------------------------------------------------------- Patient Info  ID #:       [id number]                          D.O.B.:  [birth year] (36 yrs)  Name:       Yvonne Adams                   Visit Date: 03/23/2017 08:42 am              Yvonne Adams ---------------------------------------------------------------------- Performed By  Performed By:     Tommi Emery         Ref. Address:     [address]                                                             [place], [place]                                                             [address]  Attending:        Charlsie Merles MD         Location:         [facility]  Referred By:      Adam Phenix                    MD ---------------------------------------------------------------------- Orders   #  Description                                 Code   1  [place] MFM OB DETAIL +14 WK  16109.60   2  [place] MFM FETAL BPP WO NON STRESS              76819.01  ----------------------------------------------------------------------   #  Ordered By               Order #        Accession #    Episode #   1  Scheryl Darter             [id number]      [id number]     [id number]   2  Scheryl Darter             [id number]      [id number]     [id number]  ---------------------------------------------------------------------- Indications   [redacted] weeks gestation of pregnancy                Z3A.33   Encounter for antenatal screening for          Z36.3   malformations   Gestational diabetes in pregnancy, diet        O24.410   controlled  ---------------------------------------------------------------------- OB History  Blood Type:            Height:  5'3"   Weight (lb):  180       BMI:                                                       lb  Gravidity:    7         Term:   3        Prem:   0        SAB:   3  Living:       3 ---------------------------------------------------------------------- Fetal Evaluation  Num Of Fetuses:     1  Fetal Heart         134  Rate(bpm):  Cardiac Activity:   Observed  Presentation:       Breech  Placenta:           Anterior, above cervical os  P. Cord Insertion:  Visualized  Amniotic Fluid  AFI FV:      Subjectively within normal limits  AFI Sum(cm)     %Tile       Largest Pocket(cm)  18.11           67          5.83  RUQ(cm)       RLQ(cm)       LUQ(cm)        LLQ(cm)  4.63          2.46          5.83           5.19 ---------------------------------------------------------------------- Biophysical Evaluation  Amniotic F.V:   Within normal limits       F. Tone:        Observed  F. Movement:    Observed                   Score:          8/8  F. Breathing:   Observed ---------------------------------------------------------------------- Biometry  BPD:      81.7  mm     G. Age:  32w 6d         22  %  CI:        71.53   %    70 - 86                                                          FL/HC:      21.0   %    19.4 - 21.8  HC:      307.6  mm     G. Age:  34w 2d         28  %    HC/AC:      1.01        0.96 - 1.11  AC:      303.9  mm     G. Age:  34w 3d         71  %    FL/BPD:     79.1   %    71 - 87  FL:        64.6  mm     G. Age:  33w 2d         30  %    FL/AC:      21.3   %    20 - 24  CER:        45  mm     G. Age:  39w 0d       > 95  %  Est. FW:    2305  gm      5 lb 1 oz     63  % ---------------------------------------------------------------------- Gestational Age  LMP:           33w 5d        Date:  07/30/16                 EDD:   05/06/17  U/S Today:     33w 5d                                        EDD:   05/06/17  Best:          33w 5d     Det. By:  LMP  (07/30/16)          EDD:   05/06/17 ---------------------------------------------------------------------- Anatomy  Cranium:               Appears normal         Aortic Arch:            Appears normal  Cavum:                 Appears normal         Ductal Arch:            Appears normal  Ventricles:            Appears normal         Diaphragm:              Appears normal  Choroid Plexus:        Not well visualized    Stomach:                Appears normal, left  sided  Cerebellum:            Appears normal         Abdomen:                Appears normal  Posterior Fossa:       Appears normal         Abdominal Wall:         Not well visualized  Nuchal Fold:           Not well visualized    Cord Vessels:           Appears normal (3                                                                        vessel cord)  Face:                  Appears normal         Kidneys:                Appear normal                         (orbits and profile)  Lips:                  Appears normal         Bladder:                Appears normal  Thoracic:              Appears normal         Spine:                  Not well visualized  Heart:                 Appears normal         Upper Extremities:      Not well visualized                         (4CH, axis, and situs  RVOT:                  Appears normal         Lower Extremities:      Visualized  LVOT:                  Appears normal  Other:  Gender not well  visualized. ---------------------------------------------------------------------- Cervix Uterus Adnexa  Cervix  Not visualized (advanced GA >29wks)  Uterus  No abnormality visualized.  Left Ovary  Not visualized. No adnexal mass visualized.  Right Ovary  Not visualized. No adnexal mass visualized. ---------------------------------------------------------------------- Impression  Singleton intrauterine pregnancy at 33+5 weeks with GDM,  here for inital evaluation  Normal fetal movement and cardiac activity  Review of the anatomy shows no sonographic markers for  aneuploidy or structural anomalies  However, evaluation is suboptimal secondary to late EGA  and fetal position  Amniotic fluid volume is normal with an AFI of 18.1 cm  Estimated fetal weight is 2305g which is growth in the 63rd  percentile  BPP 8/8 ---------------------------------------------------------------------- Recommendations  Repeat scan for growth in 4 weeks ----------------------------------------------------------------------                      Charlsie Merles, MD Electronically Signed Corrected Final Report  03/23/2017 11:58 am ----------------------------------------------------------------------   Assessment: Patient Active Problem List   Diagnosis Date Noted  . Supervision of high risk pregnancy, antepartum 03/13/2017  . Gestational diabetes mellitus (GDM), antepartum 03/13/2017    Plan: Patient will undergo management of breech position with ECV.   The risks of procedure were discussed in detail with the patient including but not limited to: fetal intolerance, pain, ROM, placental abruption, need for emergent surgery.   Levie Heritage, DO  04/18/2017, 10:03 AM

## 2017-04-18 NOTE — Discharge Summary (Signed)
Patient Name: Yvonne Adams, female   DOB: 1980-12-27, 36 y.o.  MRN: [id number]  The above patient was seen on the Birthing suits for an outpatient procedure: external cephalic version, which was successful.  The baby was monitored for 1 hour afterwards, showing reactive FHT with accelerations and moderate variability.  Patient reported no bleeding or spotting or leaking fluid.  Patient will follow up in the clinic for continued prenatal care.  Levie HeritageStinson, Jacob J, DO 04/18/2017, 11:04 AM

## 2017-04-18 NOTE — Procedures (Signed)
After informed verbal consent, Terbutaline 0.25 mg SQ given, ECV was attempted under Ultrasound guidance.  Successful with one attempt. Will place binder.   FHR was reactive before and after the procedure.   Pt. Tolerated the procedure well.  Yvonne Adams, Jacob J, DO 04/18/2017, 10:18 AM

## 2017-04-20 ENCOUNTER — Encounter (HOSPITAL_COMMUNITY): Payer: Self-pay

## 2017-04-20 ENCOUNTER — Ambulatory Visit (HOSPITAL_COMMUNITY)
Admission: RE | Admit: 2017-04-20 | Discharge: 2017-04-20 | Disposition: A | Discharge status: Home / Self Care | Payer: Self-pay | Source: Ambulatory Visit | Attending: Obstetrics & Gynecology | Admitting: Obstetrics & Gynecology

## 2017-04-20 ENCOUNTER — Ambulatory Visit (INDEPENDENT_AMBULATORY_CARE_PROVIDER_SITE_OTHER): Payer: Self-pay | Admitting: Obstetrics & Gynecology

## 2017-04-20 ENCOUNTER — Ambulatory Visit: Payer: Self-pay | Admitting: *Deleted

## 2017-04-20 ENCOUNTER — Ambulatory Visit: Payer: Self-pay

## 2017-04-20 VITALS — BP 121/77 | HR 84 | Wt 214.2 lb

## 2017-04-20 DIAGNOSIS — O24419 Gestational diabetes mellitus in pregnancy, unspecified control: Secondary | ICD-10-CM

## 2017-04-20 DIAGNOSIS — O0990 Supervision of high risk pregnancy, unspecified, unspecified trimester: Secondary | ICD-10-CM

## 2017-04-20 DIAGNOSIS — O0993 Supervision of high risk pregnancy, unspecified, third trimester: Secondary | ICD-10-CM

## 2017-04-20 DIAGNOSIS — O24415 Gestational diabetes mellitus in pregnancy, controlled by oral hypoglycemic drugs: Secondary | ICD-10-CM

## 2017-04-20 DIAGNOSIS — Z362 Encounter for other antenatal screening follow-up: Secondary | ICD-10-CM | POA: Insufficient documentation

## 2017-04-20 DIAGNOSIS — O24410 Gestational diabetes mellitus in pregnancy, diet controlled: Secondary | ICD-10-CM

## 2017-04-20 DIAGNOSIS — Z3A37 37 weeks gestation of pregnancy: Secondary | ICD-10-CM | POA: Insufficient documentation

## 2017-04-20 LAB — POCT URINALYSIS DIP (DEVICE)
BILIRUBIN URINE: NEGATIVE
Glucose, UA: NEGATIVE mg/dL
HGB URINE DIPSTICK: NEGATIVE
KETONES UR: NEGATIVE mg/dL
NITRITE: NEGATIVE
PH: 7 (ref 5.0–8.0)
PROTEIN: NEGATIVE mg/dL
Specific Gravity, Urine: 1.02 (ref 1.005–1.030)
Urobilinogen, UA: 0.2 mg/dL (ref 0.0–1.0)

## 2017-04-20 MED ORDER — GLYBURIDE 5 MG PO TABS: ORAL_TABLET | ORAL | 0 refills | Status: DC

## 2017-04-20 MED ORDER — RANITIDINE HCL 150 MG PO TABS: 150.0000 mg | ORAL_TABLET | Freq: Two times a day (BID) | ORAL | 4 refills | Status: DC

## 2017-04-20 NOTE — Progress Notes (Signed)
Interpreter [employer] present for encounter. Observed pt has had 8 lb weight gain in 1 week.  She reports severe swelling of her feet and ankles causing difficulty walking.  Pt had successful version on 9/11.  She reports having increased amount of heartburn and reflux.     PRENATAL VISIT NOTE  Subjective:  Yvonne PlummerMarlene Guadalupe Adams is a 36 y.o. [phone] at 3129w1d being seen today for ongoing prenatal care.  She is currently monitored for the following issues for this high-risk pregnancy and has Supervision of high risk pregnancy, antepartum and Gestational diabetes mellitus (GDM), antepartum on her problem list.  Patient reports reflux symptoms.  Contractions: Not present. Vag. Bleeding: None.  Movement: Present. Denies leaking of fluid.   The following portions of the patient's history were reviewed and updated as appropriate: allergies, current medications, past family history, past medical history, past social history, past surgical history and problem list. Problem list updated.  Objective:   Vitals:   04/20/17 0839  BP: 121/77  Pulse: 84  Weight: 214 lb 3.2 oz (97.2 kg)    Fetal Status: Fetal Heart Rate (bpm): NST   Movement: Present     General:  Alert, oriented and cooperative. Patient is in no acute distress.  Skin: Skin is warm and dry. No rash noted.   Cardiovascular: Normal heart rate noted  Respiratory: Normal respiratory effort, no problems with respiration noted  Abdomen: Soft, gravid, appropriate for gestational age.  Pain/Pressure: Present     Pelvic: Cervical exam deferred        Extremities: Normal range of motion.     Mental Status:  Normal mood and affect. Normal behavior. Normal judgment and thought content.   Assessment and Plan:  Pregnancy: [id number] at 8329w1d  1. Supervision of high risk pregnancy, antepartum Growth US on 9/13 is 80%.  No further US needed.  Induction at 39 weeks.  2. Gestational diabetes mellitus (GDM), antepartum Elevated valued  suring the day.  Add Glyburide in a.m.  Teach back method taught with interpreter - glyBURIDE (DIABETA) 5 MG tablet; Take 1 tablet in the morning and 1 tablet in the evening  Dispense: 60 tablet; Refill: 0  3.  Reflux Zantac ordered.  Term labor symptoms and general obstetric precautions including but not limited to vaginal bleeding, contractions, leaking of fluid and fetal movement were reviewed in detail with the patient. Please refer to After Visit Summary for other counseling recommendations.  Return in about 7 days (around 04/27/2017) for NST/BPP and Ob fu.   Elsie LincolnKelly Demaya Hardge, MD

## 2017-04-20 NOTE — Progress Notes (Signed)
US for growth and BPP today @ 1000.

## 2017-04-20 NOTE — Procedures (Signed)
Antonieta PertMarlene Guadalupe [facility] 07-Oct-1980 5628w5d  Fetus A Non-Stress Test Interpretation for 04/20/17  Indication: Unsatisfactory BPP  Fetal Heart Rate A Mode: External Baseline Rate (A): 135 bpm Variability: Moderate Accelerations: 15 x 15 Decelerations: None Multiple birth?: No  Uterine Activity Mode: Palpation, Toco Contraction Frequency (min): none  Interpretation (Fetal Testing) Nonstress Test Interpretation: Reactive Overall Impression: Reassuring for gestational age Comments: Reviewed tracing with Dr. Ezzard StandingNewman

## 2017-04-20 NOTE — Addendum Note (Signed)
Encounter addended by: Earley Brookealrymple, Lilliana Turner S on: 04/20/2017 11:35 AM<BR>    Actions taken: Imaging Exam ended

## 2017-04-26 ENCOUNTER — Encounter: Payer: Self-pay | Admitting: Family Medicine

## 2017-04-27 ENCOUNTER — Ambulatory Visit: Payer: Self-pay

## 2017-04-27 ENCOUNTER — Ambulatory Visit (INDEPENDENT_AMBULATORY_CARE_PROVIDER_SITE_OTHER): Payer: Self-pay | Admitting: Family Medicine

## 2017-04-27 ENCOUNTER — Encounter: Payer: Self-pay | Admitting: Advanced Practice Midwife

## 2017-04-27 ENCOUNTER — Ambulatory Visit (INDEPENDENT_AMBULATORY_CARE_PROVIDER_SITE_OTHER): Payer: Self-pay | Admitting: *Deleted

## 2017-04-27 ENCOUNTER — Other Ambulatory Visit: Payer: Self-pay

## 2017-04-27 ENCOUNTER — Encounter: Payer: Self-pay | Admitting: *Deleted

## 2017-04-27 VITALS — BP 127/80 | HR 82 | Temp 98.6°F | Wt 216.3 lb

## 2017-04-27 DIAGNOSIS — O24415 Gestational diabetes mellitus in pregnancy, controlled by oral hypoglycemic drugs: Secondary | ICD-10-CM

## 2017-04-27 DIAGNOSIS — O0990 Supervision of high risk pregnancy, unspecified, unspecified trimester: Secondary | ICD-10-CM

## 2017-04-27 DIAGNOSIS — Z23 Encounter for immunization: Secondary | ICD-10-CM

## 2017-04-27 DIAGNOSIS — O0993 Supervision of high risk pregnancy, unspecified, third trimester: Secondary | ICD-10-CM

## 2017-04-27 MED ORDER — TETANUS-DIPHTH-ACELL PERTUSSIS 5-2.5-18.5 LF-MCG/0.5 IM SUSP
0.5000 mL | Freq: Once | INTRAMUSCULAR | Status: AC
  Administered 2017-04-27: 0.5 mL via INTRAMUSCULAR

## 2017-04-27 NOTE — Progress Notes (Signed)
Interpreter [employer] present for encounter.  [place] for growth done 9/13.  IOL scheduled on 9/22 @ midnight

## 2017-04-28 ENCOUNTER — Telehealth (HOSPITAL_COMMUNITY): Payer: Self-pay | Admitting: *Deleted

## 2017-04-28 NOTE — Progress Notes (Signed)
   PRENATAL VISIT NOTE  Subjective:  Yvonne Adams is a 36 y.o. [phone] at [redacted]w[redacted]d being seen today for ongoing prenatal care.  She is currently monitored for the following issues for this high-risk pregnancy and has Supervision of high risk pregnancy, antepartum and Gestational diabetes mellitus (GDM), antepartum on her problem list.  Patient reports no complaints.  Contractions: Irregular. Vag. Bleeding: None.  Movement: Present. Denies leaking of fluid.   The following portions of the patient's history were reviewed and updated as appropriate: allergies, current medications, past family history, past medical history, past social history, past surgical history and problem list. Problem list updated.  Objective:   Vitals:   04/27/17 1456  BP: 127/80  Pulse: 82  Temp: 98.6 F (37 C)  Weight: 216 lb 4.8 oz (98.1 kg)    Fetal Status: Fetal Heart Rate (bpm): NST   Movement: Present     General:  Alert, oriented and cooperative. Patient is in no acute distress.  Skin: Skin is warm and dry. No rash noted.   Cardiovascular: Normal heart rate noted  Respiratory: Normal respiratory effort, no problems with respiration noted  Abdomen: Soft, gravid, appropriate for gestational age.  Pain/Pressure: Present     Pelvic: Cervical exam deferred        Extremities: Normal range of motion.  Edema: Mild pitting, slight indentation  Mental Status:  Normal mood and affect. Normal behavior. Normal judgment and thought content.   Assessment and Plan:  Pregnancy: [id number] at [redacted]w[redacted]d  1. Supervision of high risk pregnancy, antepartum FHT and FH normal - Tdap (BOOSTRIX) injection 0.5 mL; Inject 0.5 mLs into the muscle once.  2. Gestational diabetes mellitus (GDM) in third trimester controlled on oral hypoglycemic drug NST reactive BPP 10/10 Induction scheduled for 39 weeks.  3. Needs flu shot - Flu Vaccine QUAD 36+ mos IM (Fluarix, Quad PF)  Term labor symptoms and general  obstetric precautions including but not limited to vaginal bleeding, contractions, leaking of fluid and fetal movement were reviewed in detail with the patient. Please refer to After Visit Summary for other counseling recommendations.  Return in about 7 weeks (around 06/12/2017) for PP visit and 2hr GTT.  IOL on 9/22.   Levie Heritage, DO

## 2017-04-28 NOTE — Telephone Encounter (Signed)
Preadmission screen Interpreter number [phone]

## 2017-04-29 ENCOUNTER — Other Ambulatory Visit: Payer: Self-pay | Admitting: Advanced Practice Midwife

## 2017-04-29 ENCOUNTER — Inpatient Hospital Stay (HOSPITAL_COMMUNITY): Payer: Medicaid Other | Admitting: Anesthesiology

## 2017-04-29 ENCOUNTER — Inpatient Hospital Stay (HOSPITAL_COMMUNITY)
Admission: RE | Admit: 2017-04-29 | Discharge: 2017-05-01 | DRG: 775 | Disposition: A | Discharge status: Home / Self Care | Payer: Medicaid Other | Source: Ambulatory Visit | Attending: Obstetrics & Gynecology | Admitting: Obstetrics & Gynecology

## 2017-04-29 ENCOUNTER — Encounter (HOSPITAL_COMMUNITY): Payer: Self-pay

## 2017-04-29 DIAGNOSIS — O24425 Gestational diabetes mellitus in childbirth, controlled by oral hypoglycemic drugs: Secondary | ICD-10-CM | POA: Diagnosis present

## 2017-04-29 DIAGNOSIS — Z3A39 39 weeks gestation of pregnancy: Secondary | ICD-10-CM

## 2017-04-29 DIAGNOSIS — O24415 Gestational diabetes mellitus in pregnancy, controlled by oral hypoglycemic drugs: Secondary | ICD-10-CM | POA: Diagnosis present

## 2017-04-29 DIAGNOSIS — Z6838 Body mass index (BMI) 38.0-38.9, adult: Secondary | ICD-10-CM

## 2017-04-29 DIAGNOSIS — O24419 Gestational diabetes mellitus in pregnancy, unspecified control: Secondary | ICD-10-CM | POA: Diagnosis present

## 2017-04-29 DIAGNOSIS — O99214 Obesity complicating childbirth: Secondary | ICD-10-CM | POA: Diagnosis present

## 2017-04-29 DIAGNOSIS — E669 Obesity, unspecified: Secondary | ICD-10-CM | POA: Diagnosis present

## 2017-04-29 DIAGNOSIS — O0990 Supervision of high risk pregnancy, unspecified, unspecified trimester: Secondary | ICD-10-CM

## 2017-04-29 LAB — CBC
HCT: 39.9 % (ref 36.0–46.0)
Hemoglobin: 13.9 g/dL (ref 12.0–15.0)
MCH: 29.8 pg (ref 26.0–34.0)
MCHC: 34.8 g/dL (ref 30.0–36.0)
MCV: 85.6 fL (ref 78.0–100.0)
PLATELETS: 264 10*3/uL (ref 150–400)
RBC: 4.66 MIL/uL (ref 3.87–5.11)
RDW: 14.5 % (ref 11.5–15.5)
WBC: 11.9 10*3/uL — AB (ref 4.0–10.5)

## 2017-04-29 LAB — GLUCOSE, CAPILLARY
Glucose-Capillary: 73 mg/dL (ref 65–99)
Glucose-Capillary: 62 mg/dL — ABNORMAL LOW (ref 65–99)
Glucose-Capillary: 63 mg/dL — ABNORMAL LOW (ref 65–99)

## 2017-04-29 LAB — ABO/RH: ABO/RH(D): O POS

## 2017-04-29 LAB — TYPE AND SCREEN
ABO/RH(D): O POS
Antibody Screen: NEGATIVE

## 2017-04-29 MED ORDER — LIDOCAINE HCL (PF) 1 % IJ SOLN
30.0000 mL | INTRAMUSCULAR | Status: DC | PRN
  Filled 2017-04-29: qty 30

## 2017-04-29 MED ORDER — EPHEDRINE 5 MG/ML INJ
10.0000 mg | INTRAVENOUS | Status: DC | PRN
  Filled 2017-04-29: qty 2

## 2017-04-29 MED ORDER — LACTATED RINGERS IV SOLN: 500.0000 mL | INTRAVENOUS | Status: DC | PRN

## 2017-04-29 MED ORDER — SIMETHICONE 80 MG PO CHEW: 80.0000 mg | CHEWABLE_TABLET | ORAL | Status: DC | PRN

## 2017-04-29 MED ORDER — PRENATAL VITAMINS 0.8 MG PO TABS: 1.0000 | ORAL_TABLET | Freq: Every day | ORAL | Status: DC

## 2017-04-29 MED ORDER — WITCH HAZEL-GLYCERIN EX PADS: 1.0000 "application " | MEDICATED_PAD | CUTANEOUS | Status: DC | PRN

## 2017-04-29 MED ORDER — FENTANYL CITRATE (PF) 100 MCG/2ML IJ SOLN: 100.0000 ug | INTRAMUSCULAR | Status: DC | PRN

## 2017-04-29 MED ORDER — ONDANSETRON HCL 4 MG/2ML IJ SOLN: 4.0000 mg | Freq: Four times a day (QID) | INTRAMUSCULAR | Status: DC | PRN

## 2017-04-29 MED ORDER — DIPHENHYDRAMINE HCL 50 MG/ML IJ SOLN: 12.5000 mg | INTRAMUSCULAR | Status: DC | PRN

## 2017-04-29 MED ORDER — LIDOCAINE HCL (PF) 1 % IJ SOLN
INTRAMUSCULAR | Status: DC | PRN
  Administered 2017-04-29 (×2): 4 mL via EPIDURAL

## 2017-04-29 MED ORDER — PHENYLEPHRINE 40 MCG/ML (10ML) SYRINGE FOR IV PUSH (FOR BLOOD PRESSURE SUPPORT)
PREFILLED_SYRINGE | INTRAVENOUS | Status: AC
  Filled 2017-04-29: qty 10

## 2017-04-29 MED ORDER — TETANUS-DIPHTH-ACELL PERTUSSIS 5-2.5-18.5 LF-MCG/0.5 IM SUSP: 0.5000 mL | Freq: Once | INTRAMUSCULAR | Status: DC

## 2017-04-29 MED ORDER — LACTATED RINGERS IV SOLN
INTRAVENOUS | Status: DC
  Administered 2017-04-29: 09:00:00 via INTRAVENOUS

## 2017-04-29 MED ORDER — PHENYLEPHRINE 40 MCG/ML (10ML) SYRINGE FOR IV PUSH (FOR BLOOD PRESSURE SUPPORT)
80.0000 ug | PREFILLED_SYRINGE | INTRAVENOUS | Status: DC | PRN
  Filled 2017-04-29: qty 5

## 2017-04-29 MED ORDER — FENTANYL 2.5 MCG/ML BUPIVACAINE 1/10 % EPIDURAL INFUSION (WH - ANES)
14.0000 mL/h | INTRAMUSCULAR | Status: DC | PRN
  Administered 2017-04-29: 13 mL/h via EPIDURAL

## 2017-04-29 MED ORDER — ACETAMINOPHEN 325 MG PO TABS
650.0000 mg | ORAL_TABLET | ORAL | Status: DC | PRN
  Administered 2017-04-30 (×2): 650 mg via ORAL
  Filled 2017-04-29 (×2): qty 2

## 2017-04-29 MED ORDER — OXYCODONE-ACETAMINOPHEN 5-325 MG PO TABS: 2.0000 | ORAL_TABLET | ORAL | Status: DC | PRN

## 2017-04-29 MED ORDER — OXYTOCIN 40 UNITS IN LACTATED RINGERS INFUSION - SIMPLE MED
2.5000 [IU]/h | INTRAVENOUS | Status: DC
  Filled 2017-04-29: qty 1000

## 2017-04-29 MED ORDER — DIBUCAINE 1 % RE OINT: 1.0000 "application " | TOPICAL_OINTMENT | RECTAL | Status: DC | PRN

## 2017-04-29 MED ORDER — LORATADINE 10 MG PO TABS
10.0000 mg | ORAL_TABLET | Freq: Every day | ORAL | Status: DC
  Administered 2017-04-30: 10 mg via ORAL
  Filled 2017-04-29: qty 1

## 2017-04-29 MED ORDER — ONDANSETRON HCL 4 MG PO TABS: 4.0000 mg | ORAL_TABLET | ORAL | Status: DC | PRN

## 2017-04-29 MED ORDER — SODIUM BICARBONATE 8.4 % IV SOLN
INTRAVENOUS | Status: DC | PRN
  Administered 2017-04-29 (×2): 5 mL via EPIDURAL

## 2017-04-29 MED ORDER — COCONUT OIL OIL: 1.0000 "application " | TOPICAL_OIL | Status: DC | PRN

## 2017-04-29 MED ORDER — TERBUTALINE SULFATE 1 MG/ML IJ SOLN
0.2500 mg | Freq: Once | INTRAMUSCULAR | Status: DC | PRN
  Filled 2017-04-29: qty 1

## 2017-04-29 MED ORDER — ONDANSETRON HCL 4 MG/2ML IJ SOLN: 4.0000 mg | INTRAMUSCULAR | Status: DC | PRN

## 2017-04-29 MED ORDER — DIPHENHYDRAMINE HCL 25 MG PO CAPS: 25.0000 mg | ORAL_CAPSULE | Freq: Four times a day (QID) | ORAL | Status: DC | PRN

## 2017-04-29 MED ORDER — LACTATED RINGERS IV SOLN: 500.0000 mL | Freq: Once | INTRAVENOUS | Status: DC

## 2017-04-29 MED ORDER — IBUPROFEN 600 MG PO TABS
600.0000 mg | ORAL_TABLET | Freq: Four times a day (QID) | ORAL | Status: DC
  Administered 2017-04-29 – 2017-05-01 (×8): 600 mg via ORAL
  Filled 2017-04-29 (×7): qty 1

## 2017-04-29 MED ORDER — FAMOTIDINE 20 MG PO TABS
10.0000 mg | ORAL_TABLET | Freq: Every day | ORAL | Status: DC
  Administered 2017-04-30: 10 mg via ORAL
  Filled 2017-04-29: qty 1

## 2017-04-29 MED ORDER — ACETAMINOPHEN 325 MG PO TABS: 650.0000 mg | ORAL_TABLET | ORAL | Status: DC | PRN

## 2017-04-29 MED ORDER — SENNOSIDES-DOCUSATE SODIUM 8.6-50 MG PO TABS
2.0000 | ORAL_TABLET | ORAL | Status: DC
  Administered 2017-04-30 – 2017-05-01 (×2): 2 via ORAL
  Filled 2017-04-29 (×2): qty 2

## 2017-04-29 MED ORDER — OXYTOCIN 40 UNITS IN LACTATED RINGERS INFUSION - SIMPLE MED
1.0000 m[IU]/min | INTRAVENOUS | Status: DC
  Administered 2017-04-29: 2 m[IU]/min via INTRAVENOUS
  Filled 2017-04-29: qty 1000

## 2017-04-29 MED ORDER — OXYTOCIN BOLUS FROM INFUSION: 500.0000 mL | Freq: Once | INTRAVENOUS | Status: DC

## 2017-04-29 MED ORDER — BENZOCAINE-MENTHOL 20-0.5 % EX AERO: 1.0000 "application " | INHALATION_SPRAY | CUTANEOUS | Status: DC | PRN

## 2017-04-29 MED ORDER — FENTANYL 2.5 MCG/ML BUPIVACAINE 1/10 % EPIDURAL INFUSION (WH - ANES)
INTRAMUSCULAR | Status: AC
  Filled 2017-04-29: qty 100

## 2017-04-29 MED ORDER — SOD CITRATE-CITRIC ACID 500-334 MG/5ML PO SOLN: 30.0000 mL | ORAL | Status: DC | PRN

## 2017-04-29 MED ORDER — PRENATAL MULTIVITAMIN CH
1.0000 | ORAL_TABLET | Freq: Every day | ORAL | Status: DC
  Administered 2017-04-30 – 2017-05-01 (×2): 1 via ORAL
  Filled 2017-04-29: qty 1

## 2017-04-29 MED ORDER — OXYCODONE-ACETAMINOPHEN 5-325 MG PO TABS: 1.0000 | ORAL_TABLET | ORAL | Status: DC | PRN

## 2017-04-29 MED ORDER — ZOLPIDEM TARTRATE 5 MG PO TABS: 5.0000 mg | ORAL_TABLET | Freq: Every evening | ORAL | Status: DC | PRN

## 2017-04-29 NOTE — Anesthesia Pain Management Evaluation Note (Signed)
  CRNA Pain Management Visit Note  Patient: Yvonne Adams [facility], 36 y.o., female  "Hello I am a member of the anesthesia team at [facility]. We have an anesthesia team available at all times to provide care throughout the hospital, including epidural management and anesthesia for C-section. I don't know your plan for the delivery whether it a natural birth, water birth, IV sedation, nitrous supplementation, doula or epidural, but we want to meet your pain goals."   1.Was your pain managed to your expectations on prior hospitalizations?   Yes   2.What is your expectation for pain management during this hospitalization?     Epidural  3.How can we help you reach that goal? Support prn  Record the patient's initial score and the patient's pain goal.   Pain: 0  Pain Goal: 5 [facility] wants you to be able to say your pain was always managed very well.  [facility] 04/29/2017

## 2017-04-29 NOTE — Progress Notes (Signed)
Comfortable at bedside without any sensation below legs after epidural bolus was increased. Husband supportive at bedside. SVE 6/50/-1 - cervix no longer cone shaped and equal circumference from top to bottom; she is stretchy to 7. Pit running at 10. FHR 140, mod, +accels, occ variables, q3-4. Continue current management. Burna Cash, MD Family Medicine Resident, PGY-3 04/29/2017 3:15 PM

## 2017-04-29 NOTE — Anesthesia Preprocedure Evaluation (Signed)
Anesthesia Evaluation  Patient identified by MRN, date of birth, ID band Patient awake    Reviewed: Allergy & Precautions, Patient's Chart, lab work & pertinent test results  Airway Mallampati: III  TM Distance: >3 FB Neck ROM: Full    Dental no notable dental hx. (+) Teeth Intact   Pulmonary neg pulmonary ROS,    Pulmonary exam normal breath sounds clear to auscultation       Cardiovascular negative cardio ROS Normal cardiovascular exam Rhythm:Regular Rate:Normal     Neuro/Psych negative neurological ROS  negative psych ROS   GI/Hepatic negative GI ROS, Neg liver ROS,   Endo/Other  diabetes, Well Controlled, Gestational, Oral Hypoglycemic AgentsObesity  Renal/GU negative Renal ROS  negative genitourinary   Musculoskeletal negative musculoskeletal ROS (+)   Abdominal (+) + obese,   Peds  Hematology  (+) anemia ,   Anesthesia Other Findings   Reproductive/Obstetrics (+) Pregnancy                             Anesthesia Physical Anesthesia Plan  ASA: II  Anesthesia Plan: Epidural   Post-op Pain Management:    Induction:   PONV Risk Score and Plan:   Airway Management Planned: Natural Airway  Additional Equipment:   Intra-op Plan:   Post-operative Plan:   Informed Consent: I have reviewed the patients History and Physical, chart, labs and discussed the procedure including the risks, benefits and alternatives for the proposed anesthesia with the patient or authorized representative who has indicated his/her understanding and acceptance.     Plan Discussed with: Anesthesiologist  Anesthesia Plan Comments:         Anesthesia Quick Evaluation

## 2017-04-29 NOTE — H&P (Signed)
OBSTETRIC ADMISSION HISTORY AND PHYSICAL  Yvonne Adams is a 36 y.o. female [phone] with IUP at [redacted]w[redacted]d by 14w [place] c/w LMP presenting for IOL for GDMA2. She reports +FMs, No LOF, no VB, no blurry vision, headaches or peripheral edema, and RUQ pain.  She plans on breastfeeding. She requests BTL for birth control - never signed papers. She received her prenatal care at [facility]   Dating: By LMP c/w 14w [place] --->  Estimated Date of Delivery: 05/06/17  Sono:   04/20/17: 37+5, cephalic, EFW - 3372g, 80th%, HC:AC 0.91 12/19/16 20+2, normal anatomy 11/08/16 14+3   Prenatal History/Complications:  Past Medical History: Past Medical History:  Diagnosis Date  . Anemia   . Diabetes mellitus without complication (HCC)   . Gestational diabetes   . Ovarian cyst   . S/P cholecystectomy 2015    Past Surgical History: Past Surgical History:  Procedure Laterality Date  . CHOLECYSTECTOMY    . OVARIAN CYST REMOVAL Right 2015    Obstetrical History: OB History    Gravida Para Term Preterm AB Living   SAB TAB Ectopic Multiple Live Births   3       3      Social History: Social History   Social History  . Marital status: Married    Spouse name: N/A  . Number of children: N/A  . Years of education: N/A   Social History Main Topics  . Smoking status: Never Smoker  . Smokeless tobacco: Never Used  . Alcohol use No  . Drug use: No  . Sexual activity: Yes    Birth control/ protection: Pill   Other Topics Concern  . None   Social History Narrative  . None    Family History: No family history on file.  Allergies: No Known Allergies  Prescriptions Prior to Admission  Medication Sig Dispense Refill Last Dose  . acetaminophen (TYLENOL) 500 MG tablet Take 500 mg by mouth every 6 (six) hours as needed.   Not Taking  . glyBURIDE (DIABETA) 5 MG tablet Take 1 tablet in the morning and 1 tablet in the evening 60 tablet 0 Taking  . Prenatal Multivit-Min-Fe-FA  (PRENATAL VITAMINS) 0.8 MG tablet Take 1 tablet by mouth daily. (Patient not taking: Reported on 04/27/2017) 30 tablet 12 Not Taking  . ranitidine (ZANTAC) 150 MG tablet Take 1 tablet (150 mg total) by mouth 2 (two) times daily. 60 tablet 4 Taking     Review of Systems   All systems reviewed and negative except as stated in HPI  Blood pressure 135/71, pulse 75, temperature 99.3 F (37.4 C), resp. rate 20, height  (1.6 m), weight 98 kg (216 lb), last menstrual period 07/30/2016. General appearance: alert, cooperative and no distress Lungs: clear to auscultation bilaterally Heart: regular rate and rhythm Abdomen: soft, non-tender; bowel sounds normal Pelvic: adequate Extremities: Homans sign is negative, no sign of DVT Presentation: cephalic Fetal monitoring: Baseline: 140 bpm, Variability: Good {> 6 bpm), Accelerations: Reactive and Decelerations: Absent Uterine activity infrequent Dilation: 4 Effacement (%): 50 Station: -1 Exam by:: dr moss   Prenatal labs: ABO, Rh: --/--/O POS (09/22 0913) Antibody: NEG (09/22 0913) Rubella: Immune (03/19 0000) RPR: Nonreactive (03/19 0000)  HBsAg: Negative (03/19 0000)  HIV: Non-reactive (03/19 0000)  GBS: Negative (09/06 1639)  Random glucose 97 Genetic screening  Declined 1st trimester, Quad screen 2nd trimester negative Anatomy US normal  Prenatal Transfer Tool  Maternal Diabetes: Yes:  Diabetes Type:  Insulin/Medication controlled Genetic Screening: Declined 1st trimester, 2nd trimester Quad normal Maternal Ultrasounds/Referrals: Normal Fetal Ultrasounds or other Referrals:  None Maternal Substance Abuse:  No Significant Maternal Medications:  None Significant Maternal Lab Results: None  Results for orders placed or performed during the hospital encounter of 04/29/17 (from the past 24 hour(s))  CBC   Collection Time: 04/29/17  9:13 AM  Result Value Ref Range   WBC 11.9 (H) 4.0 - 10.5 K/uL   RBC 4.66 3.87 - 5.11 MIL/uL    Hemoglobin 13.9 12.0 - 15.0 g/dL   HCT 65.7 84.6 - 96.2 %   MCV 85.6 78.0 - 100.0 fL   MCH 29.8 26.0 - 34.0 pg   MCHC 34.8 30.0 - 36.0 g/dL   RDW 95.2 84.1 - 32.4 %   Platelets 264 150 - 400 K/uL  Type and screen   Collection Time: 04/29/17  9:13 AM  Result Value Ref Range   ABO/RH(D) O POS    Antibody Screen NEG    Sample Expiration 05/02/2017   Glucose, capillary   Collection Time: 04/29/17  9:16 AM  Result Value Ref Range   Glucose-Capillary 73 65 - 99 mg/dL    Patient Active Problem List   Diagnosis Date Noted  . Gestational diabetes mellitus (GDM) controlled on oral hypoglycemic drug 04/29/2017  . Supervision of high risk pregnancy, antepartum 03/13/2017  . Gestational diabetes mellitus (GDM), antepartum 03/13/2017    Assessment/Plan:  [name] is a 36 y.o. [id number] at [redacted]w[redacted]d here for IOL for GDMA2 on glyburide.  #Labor: pitocin for induction, then consider AROM once good contraction pattern achieved. #GDMA2: CBG q4hrs, baby feels large by Leopold's - approx 9#, pelvis proven to 9.5#. Likely shoulder precautions at delivery. #Pain: Considering epidural #FWB: Category 1, overall reassuring #ID:  GBS negative #MOF: breast #MOC: BTL - papers never signed in clinic #Circ:  Undecided  Burnard Leigh, MD  04/29/2017, 10:46 AM

## 2017-04-29 NOTE — Progress Notes (Signed)
Labor Progress Note Yvonne Adams is a 36 y.o. [id number] at [redacted]w[redacted]d presented for IOL 2/2 GMDA2.  S: Patient admits that contractions are getting stronger.  O:  BP 131/73   Pulse 71   Temp 99.3 F (37.4 C)   Resp 16   Ht  (1.6 m)   Wt 98 kg (216 lb)   LMP 07/30/2016 (Exact Date)   BMI 38.26 kg/m  EFM: 140/pos acels/no decels; ctx q69min  CVE: Dilation: (P) 6 Effacement (%): 50 Station: -1 Presentation: Vertex Exam by:: dr Omarion Minnehan    A&P: 36 y.o. [id number] [redacted]w[redacted]d IOL 2/2 GDMA2.  #Labor: Progressing well. AROM , clear fluid #Pain: desires epidural later  #GBS negative  Terrian Ridlon, DO 12:30 PM

## 2017-04-29 NOTE — Anesthesia Procedure Notes (Signed)
Epidural Patient location during procedure: OB Start time: 04/29/2017 1:27 PM  Staffing Anesthesiologist: Mal Amabile  Preanesthetic Checklist Completed: patient identified, site marked, surgical consent, pre-op evaluation, timeout performed, IV checked, risks and benefits discussed and monitors and equipment checked  Epidural Patient position: sitting Prep: site prepped and draped and DuraPrep Patient monitoring: continuous pulse ox and blood pressure Approach: midline Location: L3-L4 Injection technique: LOR air  Needle:  Needle type: Tuohy  Needle gauge: 17 G Needle length: 9 cm and 9 Needle insertion depth: 6 cm Catheter type: closed end flexible Catheter size: 19 Gauge Catheter at skin depth: 11 cm Test dose: negative and Other  Assessment Events: blood not aspirated, injection not painful, no injection resistance, negative IV test and no paresthesia  Additional Notes Patient identified. Risks and benefits discussed including failed block, incomplete  Pain control, post dural puncture headache, nerve damage, paralysis, blood pressure Changes, nausea, vomiting, reactions to medications-both toxic and allergic and post Partum back pain. All questions were answered. Patient expressed understanding and wished to proceed. Sterile technique was used throughout procedure. Epidural site was Dressed with sterile barrier dressing. No paresthesias, signs of intravascular injection Or signs of intrathecal spread were encountered.  Patient was more comfortable after the epidural was dosed. Please see RN's note for documentation of vital signs and FHR which are stable.

## 2017-04-30 LAB — GLUCOSE, CAPILLARY: Glucose-Capillary: 84 mg/dL (ref 65–99)

## 2017-04-30 LAB — RPR: RPR Ser Ql: NONREACTIVE

## 2017-04-30 NOTE — Progress Notes (Signed)
CSW acknowledges consult for MOB LPNC @ 32wks. This writer reviewed patients chart and saw noted she had transferred care from the health department to [facility] at 32 wks. Patient had initial PNC on time. Please consult CSW is any needs arise or at MOB's request.   Raye Wiens, MSW, LCSW-A Clinical Social Worker  [facility]  Office: [phone]  

## 2017-04-30 NOTE — Lactation Note (Addendum)
This note was copied from a baby's chart. Lactation Consultation Note  Patient Name: Yvonne Adams [id number] Date: 04/30/2017   Baby 21 hours old. Offered interpretive service, mom declined stating that she has switched to formula--mom giving a bottle of formula when LC entered the room.   Maternal Data    Feeding Feeding Type: Bottle Fed - Formula Length of feed: 10 min  LATCH Score                   Interventions    Lactation Tools Discussed/Used     Consult Status      Yvonne Adams 04/30/2017, 3:04 PM

## 2017-04-30 NOTE — Progress Notes (Signed)
Post Partum Day 1  Subjective:  Yvonne Adams is a 36 y.o. [id number] [redacted]w[redacted]d s/p NSVD.  No acute events overnight.  Pt denies problems with ambulating, voiding or po intake.  She denies nausea or vomiting.  Pain is well controlled.  She has not had flatus. She has not had bowel movement.  Lochia Moderate.  Plan for birth control is bilateral tubal ligation (future).  Method of Feeding: breast  Objective: BP (!) 112/57 (BP Location: Left Arm)   Pulse 72   Temp 97.9 F (36.6 C) (Oral)   Resp 18   Ht  (1.6 m)   Wt 98 kg (216 lb)   LMP 07/30/2016 (Exact Date)   SpO2 100%   Breastfeeding? Unknown   BMI 38.26 kg/m   Physical Exam:  General: alert, cooperative and no distress Lochia:normal flow Chest: no increased work of breathing Abdomen: soft, nontender, fundus firm at/below umbilicus Uterine Fundus: firm DVT Evaluation: No evidence of DVT seen on physical exam. Extremities: 2+ edema bilaterally   Recent Labs  04/29/17 0913  HGB 13.9  HCT 39.9    Assessment/Plan:  ASSESSMENT: Yvonne Adams is a 36 y.o. J4N8295 [redacted]w[redacted]d ppd #1 s/p NSVD doing well.   Fasting glucose is 84 this morning, within normal limits. Plan for discharge tomorrow and Breastfeeding   LOS: 1 day   Amanda C. Frances Furbish, MD PGY-1, [facility] 04/30/2017 7:52 AM  OB FELLOW POSTPARTUM PROGRESS NOTE ATTESTATION  I have seen and examined this patient and agree with above documentation in the resident's note.   Frederik Pear, MD OB Fellow 8:51 AM

## 2017-04-30 NOTE — Anesthesia Postprocedure Evaluation (Signed)
Anesthesia Post Note  Patient: Yvonne Adams [facility]  Procedure(s) Performed: * No procedures listed *     Patient location during evaluation: Mother Baby Anesthesia Type: Epidural Level of consciousness: awake and alert, oriented and patient cooperative Pain management: pain level controlled Vital Signs Assessment: post-procedure vital signs reviewed and stable Respiratory status: spontaneous breathing Cardiovascular status: stable Postop Assessment: no headache, epidural receding, patient able to bend at knees and no signs of nausea or vomiting Anesthetic complications: no Comments: Pain score 0 at time of interview 0715.    Last Vitals:  Vitals:   04/30/17 0145 04/30/17 0500  BP: 122/65 (!) 112/57  Pulse: 69 72  Resp: 18 18  Temp: 36.6 C 36.6 C  SpO2:      Last Pain:  Vitals:   04/30/17 0931  TempSrc:   PainSc: 5    Pain Goal:                 [facility]

## 2017-05-01 DIAGNOSIS — O24425 Gestational diabetes mellitus in childbirth, controlled by oral hypoglycemic drugs: Secondary | ICD-10-CM

## 2017-05-01 DIAGNOSIS — Z3A39 39 weeks gestation of pregnancy: Secondary | ICD-10-CM

## 2017-05-01 LAB — BIRTH TISSUE RECOVERY COLLECTION (PLACENTA DONATION)

## 2017-05-01 MED ORDER — ACETAMINOPHEN 325 MG PO TABS: 650.0000 mg | ORAL_TABLET | ORAL | 0 refills | Status: AC | PRN

## 2017-05-01 MED ORDER — IBUPROFEN 600 MG PO TABS: 600.0000 mg | ORAL_TABLET | Freq: Four times a day (QID) | ORAL | 0 refills | Status: DC

## 2017-05-01 NOTE — Progress Notes (Signed)
Discharge teaching complete with translation by [facility]. Mom staying in room after discharge as baby patient in room on double phototherapy.

## 2017-05-01 NOTE — Discharge Summary (Signed)
OB Discharge Summary     Patient Name: Yvonne Adams [facility] DOB: 1980/08/17 MRN: [id number]  Date of admission: 04/29/2017 Delivering MD: Zerita Boers   Date of discharge: 05/01/2017  Admitting diagnosis: INDUCTION Intrauterine pregnancy: [redacted]w[redacted]d     Secondary diagnosis:  Principal Problem:   SVD (spontaneous vaginal delivery) Active Problems:   Supervision of high risk pregnancy, antepartum   Gestational diabetes mellitus (GDM), antepartum   Gestational diabetes mellitus (GDM) controlled on oral hypoglycemic drug  Additional problems: none     Discharge diagnosis: Term Pregnancy Delivered and GDM A2                                                                                                Post partum procedures:none  Augmentation: AROM and Pitocin  Complications: None  Hospital course:  Induction of Labor With Vaginal Delivery   36 y.o. yo [id number] at [redacted]w[redacted]d was admitted to the hospital 04/29/2017 for induction of labor.  Indication for induction: A2 DM.  Patient had an uncomplicated labor course as follows: Membrane Rupture Time/Date: 12:27 PM ,04/29/2017   Intrapartum Procedures: Episiotomy: None [1]                                         Lacerations:  Periurethral [8]  Patient had delivery of a Viable infant.  Information for the patient's newborn:  Yvonne Adams, Yvonne Adams [[id number]]  Delivery Method: Vag-Spont   04/29/2017  Details of delivery can be found in separate delivery note.  Patient had a routine postpartum course. Patient is discharged home 05/01/17.  Physical exam  Vitals:   04/30/17 0145 04/30/17 0500 04/30/17 1743 05/01/17 0524  BP: 122/65 (!) 112/57 119/80 (!) 106/54  Pulse: 69 72 75 68  Resp: Temp: 97.8 F (36.6 C) 97.9 F (36.6 C) 97.8 F (36.6 C) 98.4 F (36.9 C)  TempSrc: Oral Oral Oral Oral  SpO2:      Weight:      Height:       General: alert, cooperative and no distress Lochia: appropriate Uterine  Fundus: firm Incision: N/A DVT Evaluation: No evidence of DVT seen on physical exam. No cords or calf tenderness. Calf/Ankle edema is present bilaterally Labs: Lab Results  Component Value Date   WBC 11.9 (H) 04/29/2017   HGB 13.9 04/29/2017   HCT 39.9 04/29/2017   MCV 85.6 04/29/2017   PLT 264 04/29/2017   No flowsheet data found.  Discharge instruction: per After Visit Summary and "Baby and Me Booklet".  After visit meds:  Allergies as of 05/01/2017   No Known Allergies     Medication List    STOP taking these medications   glyBURIDE 5 MG tablet Commonly known as:  DIABETA     TAKE these medications   acetaminophen 325 MG tablet Commonly known as:  TYLENOL Take 2 tablets (650 mg total) by mouth every 4 (four) hours as needed (for pain scale < 4).   ibuprofen 600 MG tablet Commonly  known as:  ADVIL,MOTRIN Take 1 tablet (600 mg total) by mouth every 6 (six) hours.   loratadine 10 MG tablet Commonly known as:  CLARITIN Take 10 mg by mouth daily.   Prenatal Vitamins 0.8 MG tablet Take 1 tablet by mouth daily.   ranitidine 150 MG tablet Commonly known as:  ZANTAC Take 1 tablet (150 mg total) by mouth 2 (two) times daily.            Discharge Care Instructions        Start     Ordered   05/01/17 0000  acetaminophen (TYLENOL) 325 MG tablet  Every 4 hours PRN     05/01/17 1010   05/01/17 0000  ibuprofen (ADVIL,MOTRIN) 600 MG tablet  Every 6 hours     05/01/17 1010      Diet: routine diet Fasting blood sugar was 84 on postpartum day 1.  Activity: Advance as tolerated. Pelvic rest for 6 weeks.   Outpatient follow up:4 weeks PPV with 2-hr GTT Follow up Appt:Future Appointments Date Time Provider Department Center  06/12/2017 7:40 AM Armando Reichert, CNM [facility]    Postpartum contraception: Tubal Ligation, however, patient did not have papers at the time of delivery.  Newborn Data: Live born female  Birth Weight: 8 lb 11.7 oz (3960  g) APGAR: 9, 9  Baby Feeding: Breast Disposition:home with mother   05/01/2017 Lennox Solders, MD  OB FELLOW DISCHARGE ATTESTATION  I have seen and examined this patient and agree with above documentation in the resident's note.   Frederik Pear, MD OB Fellow

## 2017-05-02 ENCOUNTER — Ambulatory Visit: Payer: Self-pay

## 2017-05-02 NOTE — Lactation Note (Signed)
This note was copied from a baby's chart. Lactation Consultation Note  Patient Name: Yvonne Adams [id number] Date: 05/02/2017 Reason for consult: Follow-up assessment;Hyperbilirubinemia  Follow up visit at 70 hours of age.  Mom has been breast and bottle feedings baby.  Baby has discontinued phototherapy today and is awaiting discharge.  Mom reports her breasts are more full now. LC communicating well with mom and FOB when spanish interpreter arrived to discuss discharge.  Mom denies concerns and verbally told interpreter plans to soften breast prior to latching as needed. Mom denies having pump. LC provided hand pump with instructions.  Mom has large volume of milk flowing easily with hand pump.  Storage for EBM discussed.  Mom denies further concerns. LC encouraged mo to work more on breast feedings, and using EBM instead of formula as her milk volume increases.   Discussed milk transitioning to larger volume, engorgement care discussed.  Encouraged frequent feedings. Mom to soften breast as needed prior to latch.      Maternal Data Has patient been taught Hand Expression?: Yes  Feeding Feeding Type: Breast Fed Length of feed: 20 min  LATCH Score Latch: Repeated attempts needed to sustain latch, nipple held in mouth throughout feeding, stimulation needed to elicit sucking reflex.  Audible Swallowing: A few with stimulation  Type of Nipple: Everted at rest and after stimulation  Comfort (Breast/Nipple): Soft / non-tender  Hold (Positioning): No assistance needed to correctly position infant at breast.  LATCH Score: 8  Interventions    Lactation Tools Discussed/Used Pump Review: Setup, frequency, and cleaning;Milk Storage Initiated by:: JS IBCLC Date initiated:: 05/02/17   Consult Status Consult Status: Complete    Franz Dell 05/02/2017, 4:39 PM

## 2017-06-04 ENCOUNTER — Encounter (HOSPITAL_COMMUNITY): Payer: Self-pay

## 2017-06-04 ENCOUNTER — Inpatient Hospital Stay (HOSPITAL_COMMUNITY)
Admission: AD | Admit: 2017-06-04 | Discharge: 2017-06-04 | Disposition: A | Discharge status: Home / Self Care | Payer: Self-pay | Source: Ambulatory Visit | Attending: Obstetrics and Gynecology | Admitting: Obstetrics and Gynecology

## 2017-06-04 DIAGNOSIS — O99011 Anemia complicating pregnancy, first trimester: Secondary | ICD-10-CM | POA: Insufficient documentation

## 2017-06-04 DIAGNOSIS — N644 Mastodynia: Secondary | ICD-10-CM | POA: Insufficient documentation

## 2017-06-04 DIAGNOSIS — H9201 Otalgia, right ear: Secondary | ICD-10-CM | POA: Insufficient documentation

## 2017-06-04 DIAGNOSIS — O3481 Maternal care for other abnormalities of pelvic organs, first trimester: Secondary | ICD-10-CM | POA: Insufficient documentation

## 2017-06-04 DIAGNOSIS — O24419 Gestational diabetes mellitus in pregnancy, unspecified control: Secondary | ICD-10-CM | POA: Insufficient documentation

## 2017-06-04 DIAGNOSIS — H6593 Unspecified nonsuppurative otitis media, bilateral: Secondary | ICD-10-CM | POA: Insufficient documentation

## 2017-06-04 DIAGNOSIS — Z3A01 Less than 8 weeks gestation of pregnancy: Secondary | ICD-10-CM | POA: Insufficient documentation

## 2017-06-04 DIAGNOSIS — O9989 Other specified diseases and conditions complicating pregnancy, childbirth and the puerperium: Secondary | ICD-10-CM | POA: Insufficient documentation

## 2017-06-04 DIAGNOSIS — R509 Fever, unspecified: Secondary | ICD-10-CM | POA: Insufficient documentation

## 2017-06-04 DIAGNOSIS — N83209 Unspecified ovarian cyst, unspecified side: Secondary | ICD-10-CM | POA: Insufficient documentation

## 2017-06-04 DIAGNOSIS — N61 Mastitis without abscess: Secondary | ICD-10-CM

## 2017-06-04 MED ORDER — CEPHALEXIN 500 MG PO CAPS: 500.0000 mg | ORAL_CAPSULE | Freq: Four times a day (QID) | ORAL | 0 refills | Status: AC

## 2017-06-04 MED ORDER — IBUPROFEN 600 MG PO TABS: 600.0000 mg | ORAL_TABLET | Freq: Four times a day (QID) | ORAL | 1 refills | Status: AC | PRN

## 2017-06-04 MED ORDER — CEPHALEXIN 500 MG PO CAPS
500.0000 mg | ORAL_CAPSULE | Freq: Once | ORAL | Status: AC
  Administered 2017-06-04: 500 mg via ORAL
  Filled 2017-06-04: qty 1

## 2017-06-04 MED ORDER — IBUPROFEN 600 MG PO TABS
600.0000 mg | ORAL_TABLET | Freq: Once | ORAL | Status: AC
  Administered 2017-06-04: 600 mg via ORAL
  Filled 2017-06-04: qty 1

## 2017-06-04 NOTE — Discharge Instructions (Signed)
Mastitis (Mastitis) La mastitis es una inflamacin en el tejido mamario. Generalmente ocurre en las mujeres que amamantan, pero tambin puede afectar a otras mujeres y, en algunos casos, a los hombres. CAUSAS Generalmente la causa de la mastitis es una infeccin bacteriana. Las bacterias ingresan al tejido mamario travs de cortes o grietas en la piel. Generalmente esto ocurre al amamantar, debido a las grietas o irritacin de la piel. En algunos casos puede ocurrir an cuando no haya grietas en la piel. Tambin se relaciona con la obstruccin de los conductos por los que sale la leche (lactferos). Un piercing en los pezones puede ocasionar una mastitis. Adems, algunas formas de cncer de mama pueden causar mastitis. SIGNOS Y SNTOMAS  Hinchazn, enrojecimiento, sensibilidad y dolor en la zona de la mama.  Hinchazn de los ganglios que se encuentran debajo del brazo, en el mismo lado.  Fiebre. Si se permite que la infeccin progrese, podr formarse una acumulacin de pus (absceso). DIAGNSTICO El mdico podr hacer el diagnstico de mastitis en base a sus sntomas y el examen fsico. Le indicarn estudios para confirmar el diagnstico. Estos pueden ser:  Extraccin del pus de la mama, aplicando presin en la zona. El pus se examinar en el laboratorio para determinar de qu bacteria se trata. Si hay un absceso, podrn retirarle el lquido con una aguja. Con el lquido se confirmar el diagnstico y se determinar la bacteria que causa el problema. En la mayora de los casos no se observa pus.  Le solicitarn anlisis de sangre para determinar si su organismo est luchando contra una infeccin bacteriana.  Una mamografa o una ecografa podrn descartar otros problemas o enfermedades. TRATAMIENTO Antibiticos para combatir la infeccin bacteriana. El profesional determinar qu bacteria es la que est causando la infeccin y seleccionar el tipo de antibitico ms adecuado. Podr cambiarlo  segn el resultado del cultivo o si la respuesta al antibitico no es la adecuada. Los antibiticos se administran por va oral. Tambin le recetar medicamentos para el dolor. La mastitis que se produce debido al amamantamiento podr mejorar sin tratamiento, por lo tanto el mdico podr indicarle que espere 24 horas despus de verla por primera vez para decidir si necesita recetarle un medicamento. INSTRUCCIONES PARA EL CUIDADO EN EL HOGAR  Slo tome medicamentos de venta libre o recetados para calmar el dolor, el malestar o bajar la fiebre, segn las indicaciones de su mdico.  Si su mdico le receta antibiticos, tmelos tal como le indic. Asegrese de que finaliza la prescripcin completa aunque se sienta mejor.  No use un sostn demasiado ajustado o con aro. Use un sostn blando, de soporte.  Aumente la ingestin de lquidos, especialmente si tiene fiebre.  Las mujeres que amamantan deben seguir las siguientes indicaciones:  Amamante hasta vaciar la mama. El profesional le informar si su leche es segura para el beb o debe descartarla. Podrn indicarle que deje de amamantar hasta que el mdico considere que es seguro para su beb. Use un sacaleche si le aconsejan dejar de amamantar.  Mantenga los pezones secos y limpios.  Vace la primera mama completamente antes de amamantar con la segunda. Si el beb no vaca la mama por algn motivo, utilice un sacaleche.  Si debe regresar a su empleo, use un sacaleche en el horario de trabajo para mantener los horarios.  Evite que las mamas se llenen mucho de leche (congestin) SOLICITE ATENCIN MDICA SI:  Tiene una secrecin similar a pus por la mama.  Los sntomas no mejoran con   el tratamiento indicado por su mdico dentro de los 2 das. SOLICITE ATENCIN MDICA DE INMEDIATO SI:  El dolor y la hinchazn empeoran.  Aumenta el dolor y no puede controlarlo con la medicacin.  Observa una lnea roja que se extiende desde la mama hasta la  axila.  Tiene fiebre o sntomas persistentes durante ms de 2 - 3 das.  Tiene fiebre y los sntomas empeoran repentinamente. Esta informacin no tiene como fin reemplazar el consejo del mdico. Asegrese de hacerle al mdico cualquier pregunta que tenga. Document Released: 05/04/2005 Document Revised: 07/30/2013 Document Reviewed: 02/22/2013 Elsevier Interactive Patient Education  2017 Elsevier Inc.  

## 2017-06-04 NOTE — MAU Provider Note (Signed)
Chief Complaint: Breast Pain; Fever; Chills; and Headache   First Provider Initiated Contact with Patient 06/04/17 1539     SUBJECTIVE HPI: Yvonne Adams is a 36 y.o. [id number] at 5 week S/P SVD who presents to [facility] reporting breast pain R>L, fever 101.7, chills since 06/02/17. Took IBU, Tylenol last two days. None today.   Location: Bilat breast, but mostly right outer breast Quality: sore, throbbing Severity: 10/10 on pain scale Duration: 3 days Context: breastfeeding almost exclusively Q 1.5-2 hours.  Timing: constant Modifying factors: Some improvement w/ Ibuprofen and Tylenol Associated signs and symptoms: Pos for fever, chills, body aches, right ear pain. No purulent nipple drainage.   Past Medical History:  Diagnosis Date  . Anemia   . Diabetes mellitus without complication (HCC)   . Gestational diabetes   . Ovarian cyst   . S/P cholecystectomy 2015   OB History  Gravida Para Term Preterm AB Living  7 4 4   3 4   SAB TAB Ectopic Multiple Live Births  3     0 4    # Outcome Date GA Lbr Len/2nd Weight Sex Delivery Anes PTL Lv  7 Term 04/29/17 7735w0d / 00:10 8 lb 11.7 oz (3.96 kg) F Vag-Spont EPI  LIV  6 Term 07/25/11 567w0d   F Vag-Spont EPI N LIV  5 SAB 02/10/09        FD  4 SAB 06/13/06          3 SAB 03/13/04          2 Term 12/25/00 1367w0d   F Vag-Spont EPI N LIV  1 Term 07/22/99 [id number]   F Vag-Spont Local N LIV     Past Surgical History:  Procedure Laterality Date  . CHOLECYSTECTOMY    . OVARIAN CYST REMOVAL Right 2015   Social History   Social History  . Marital status: Married    Spouse name: N/A  . Number of children: N/A  . Years of education: N/A   Occupational History  . Not on file.   Social History Main Topics  . Smoking status: Never Smoker  . Smokeless tobacco: Never Used  . Alcohol use No  . Drug use: No  . Sexual activity: Yes    Birth control/ protection:    Other Topics Concern  . Not on file    Social History Narrative  . No narrative on file   Family History  Problem Relation Age of Onset  . Osteoporosis Mother   . Cancer Maternal Grandmother    No current facility-administered medications on file prior to encounter.    Current Outpatient Prescriptions on File Prior to Encounter  Medication Sig Dispense Refill  . acetaminophen (TYLENOL) 325 MG tablet Take 2 tablets (650 mg total) by mouth every 4 (four) hours as needed (for pain scale < 4). 30 tablet 0  . ibuprofen (ADVIL,MOTRIN) 600 MG tablet Take 1 tablet (600 mg total) by mouth every 6 (six) hours. 30 tablet 0  . Prenatal Multivit-Min-Fe-FA (PRENATAL VITAMINS) 0.8 MG tablet Take 1 tablet by mouth daily. (Patient not taking: Reported on 04/27/2017) 30 tablet 12  . ranitidine (ZANTAC) 150 MG tablet Take 1 tablet (150 mg total) by mouth 2 (two) times daily. 60 tablet 4   No Known Allergies  I have reviewed patient's Past Medical Hx, Surgical Hx, Family Hx, Social Hx, medications and allergies.   Review of Systems  Constitutional: Positive for chills and fever.  HENT: Positive for ear pain. Negative  for ear discharge, rhinorrhea and sore throat.   Respiratory: Negative for cough.   Gastrointestinal: Negative for abdominal pain, diarrhea, nausea and vomiting.    OBJECTIVE Patient Vitals for the past 24 hrs:  BP Temp Temp src Pulse Resp  06/04/17 1536 139/78 (!) 100.5 F (38.1 C) Oral 95 18   Constitutional: Well-developed, well-nourished female in no acute distress.  Head: Ears: Bilat effusions w/out erythema or drainage. Tympanic membranes intact bilat.   Cardiovascular: normal rate Respiratory: normal rate and effort. CTAB Breasts: 4x5 cm tender, Non-fluctuant, erythematous slightly firm area at 9 o'clock of right breast. No nipple drainage. Left breast w/ faint erythema above areola. Breast non-engorged. Neurologic: Alert and oriented x 4.   LAB RESULTS No results found for this or any previous visit (from the  past 24 hour(s)).  IMAGING No results found.  MAU COURSE Meds ordered this encounter  Medications  . ibuprofen (ADVIL,MOTRIN) tablet 600 mg  . cephALEXin (KEFLEX) capsule 500 mg    MDM - Exam, fever, constitutional Sx 2/2 Right mastitis and possible developing left mastitis w/out evidence of abscesses.  - Ear pain 2/2 otitis media with effusion. No inflammation suggestive of infection. Doubt this is cause of fever. constitutional Sx.   ASSESSMENT 1. Mastitis, right, acute   2. Otitis media with effusion, bilateral     PLAN Discharge home in stable condition. Mastitis precautions Rx Keflex, IBU Q6.   Increase fluids and rest. Nurse on schedule making sure to empty breast completely. Follow-up Information    [facility] Follow up on 06/07/2017.   Specialty:  Obstetrics and Gynecology Why:  at 1:40pm or sooner as needed if symptoms worsen. Contact information: [address] [place] [address] [phone]          Allergies as of 06/04/2017   No Known Allergies     Medication List    STOP taking these medications   ranitidine 150 MG tablet Commonly known as:  ZANTAC     TAKE these medications   acetaminophen 325 MG tablet Commonly known as:  TYLENOL Take 2 tablets (650 mg total) by mouth every 4 (four) hours as needed (for pain scale < 4).   cephALEXin 500 MG capsule Commonly known as:  KEFLEX Take 1 capsule (500 mg total) by mouth 4 (four) times daily.   ibuprofen 600 MG tablet Commonly known as:  ADVIL,MOTRIN Take 1 tablet (600 mg total) by mouth every 6 (six) hours as needed. What changed:  when to take this  reasons to take this   Prenatal Vitamins 0.8 MG tablet Take 1 tablet by mouth daily.        Katrinka Blazing, [place], CNM 06/04/2017  4:02 PM

## 2017-06-04 NOTE — MAU Note (Addendum)
Pain in both breasts- mostly on right side for 3 days. Has fever and chills. Temp this AM was 101.7. Feels more tired and achy today. Taking ibuprofen and tylenol. Headache since Friday and ear pain today. Vag delivery 09/22. Currently breastfeeding. Reports she feels like she has cuts on the labia and it hurts when she showers.

## 2017-06-07 ENCOUNTER — Ambulatory Visit (INDEPENDENT_AMBULATORY_CARE_PROVIDER_SITE_OTHER): Payer: Self-pay | Admitting: Family Medicine

## 2017-06-07 ENCOUNTER — Encounter: Payer: Self-pay | Admitting: Family Medicine

## 2017-06-07 VITALS — BP 111/76 | HR 66 | Ht 63.0 in | Wt 192.0 lb

## 2017-06-07 DIAGNOSIS — N61 Mastitis without abscess: Secondary | ICD-10-CM

## 2017-06-07 MED ORDER — FLUCONAZOLE 150 MG PO TABS: 150.0000 mg | ORAL_TABLET | Freq: Every day | ORAL | 2 refills | Status: AC

## 2017-06-07 NOTE — Progress Notes (Signed)
Pt stated seen by MAU for right lateral side of the breast ... feel like have lump/pain for about for about 1 week. MAU they  prescribe Rx keflex.

## 2017-06-07 NOTE — Patient Instructions (Signed)
Mastitis (Mastitis) La mastitis es una inflamacin en el tejido mamario. Generalmente ocurre en las mujeres que amamantan, pero tambin puede afectar a otras mujeres y, en algunos casos, a los hombres. CAUSAS Generalmente la causa de la mastitis es una infeccin bacteriana. Las bacterias ingresan al tejido mamario travs de cortes o grietas en la piel. Generalmente esto ocurre al amamantar, debido a las grietas o irritacin de la piel. En algunos casos puede ocurrir an cuando no haya grietas en la piel. Tambin se relaciona con la obstruccin de los conductos por los que sale la leche (lactferos). Un piercing en los pezones puede ocasionar una mastitis. Adems, algunas formas de cncer de mama pueden causar mastitis. SIGNOS Y SNTOMAS  Hinchazn, enrojecimiento, sensibilidad y dolor en la zona de la mama.  Hinchazn de los ganglios que se encuentran debajo del brazo, en el mismo lado.  Fiebre. Si se permite que la infeccin progrese, podr formarse una acumulacin de pus (absceso). DIAGNSTICO El mdico podr hacer el diagnstico de mastitis en base a sus sntomas y el examen fsico. Le indicarn estudios para confirmar el diagnstico. Estos pueden ser:  Extraccin del pus de la mama, aplicando presin en la zona. El pus se examinar en el laboratorio para determinar de qu bacteria se trata. Si hay un absceso, podrn retirarle el lquido con una aguja. Con el lquido se confirmar el diagnstico y se determinar la bacteria que causa el problema. En la mayora de los casos no se observa pus.  Le solicitarn anlisis de sangre para determinar si su organismo est luchando contra una infeccin bacteriana.  Una mamografa o una ecografa podrn descartar otros problemas o enfermedades. TRATAMIENTO Antibiticos para combatir la infeccin bacteriana. El profesional determinar qu bacteria es la que est causando la infeccin y seleccionar el tipo de antibitico ms adecuado. Podr cambiarlo  segn el resultado del cultivo o si la respuesta al antibitico no es la adecuada. Los antibiticos se administran por va oral. Tambin le recetar medicamentos para el dolor. La mastitis que se produce debido al amamantamiento podr mejorar sin tratamiento, por lo tanto el mdico podr indicarle que espere 24 horas despus de verla por primera vez para decidir si necesita recetarle un medicamento. INSTRUCCIONES PARA EL CUIDADO EN EL HOGAR  Slo tome medicamentos de venta libre o recetados para calmar el dolor, el malestar o bajar la fiebre, segn las indicaciones de su mdico.  Si su mdico le receta antibiticos, tmelos tal como le indic. Asegrese de que finaliza la prescripcin completa aunque se sienta mejor.  No use un sostn demasiado ajustado o con aro. Use un sostn blando, de soporte.  Aumente la ingestin de lquidos, especialmente si tiene fiebre.  Las mujeres que amamantan deben seguir las siguientes indicaciones:  Amamante hasta vaciar la mama. El profesional le informar si su leche es segura para el beb o debe descartarla. Podrn indicarle que deje de amamantar hasta que el mdico considere que es seguro para su beb. Use un sacaleche si le aconsejan dejar de amamantar.  Mantenga los pezones secos y limpios.  Vace la primera mama completamente antes de amamantar con la segunda. Si el beb no vaca la mama por algn motivo, utilice un sacaleche.  Si debe regresar a su empleo, use un sacaleche en el horario de trabajo para mantener los horarios.  Evite que las mamas se llenen mucho de leche (congestin) SOLICITE ATENCIN MDICA SI:  Tiene una secrecin similar a pus por la mama.  Los sntomas no mejoran con   el tratamiento indicado por su mdico dentro de los 2 das. SOLICITE ATENCIN MDICA DE INMEDIATO SI:  El dolor y la hinchazn empeoran.  Aumenta el dolor y no puede controlarlo con la medicacin.  Observa una lnea roja que se extiende desde la mama hasta la  axila.  Tiene fiebre o sntomas persistentes durante ms de 2 - 3 das.  Tiene fiebre y los sntomas empeoran repentinamente. Esta informacin no tiene como fin reemplazar el consejo del mdico. Asegrese de hacerle al mdico cualquier pregunta que tenga. Document Released: 05/04/2005 Document Revised: 07/30/2013 Document Reviewed: 02/22/2013 Elsevier Interactive Patient Education  2017 Elsevier Inc.  

## 2017-06-07 NOTE — Progress Notes (Signed)
   Subjective:    Patient ID: Yvonne Adams is a 36 y.o. female presenting with No chief complaint on file.  on 06/07/2017  HPI: Here to f/u mastitis. Seen with fever to 101.7 and erythema on breasts. R>L on 10/28. No further fever, no chills. Feels much better.  Now noting burning in bilateral nipples when baby latches for last 24 hours.  Review of Systems  Constitutional: Negative for chills and fever.  Respiratory: Negative for shortness of breath.   Cardiovascular: Negative for chest pain.  Gastrointestinal: Negative for abdominal pain, nausea and vomiting.  Genitourinary: Negative for dysuria.  Skin: Negative for rash.      Objective:    Ht 5\' 3"  (1.6 m)   Wt 192 lb (87.1 kg)   BMI 34.01 kg/m  Physical Exam  Constitutional: She is oriented to person, place, and time. She appears well-developed and well-nourished. No distress.  HENT:  Head: Normocephalic and atraumatic.  Eyes: No scleral icterus.  Neck: Neck supple.  Cardiovascular: Normal rate.   Pulmonary/Chest: Effort normal. Right breast exhibits no inverted nipple and no mass. Left breast exhibits no inverted nipple and no mass.  No erythema  Abdominal: Soft.  Neurological: She is alert and oriented to person, place, and time.  Skin: Skin is warm and dry.  Psychiatric: She has a normal mood and affect.        Assessment & Plan:  Mastitis - improved--? some deep breast fungal infection now on Abx--add diflucan for 2 days and see if this improves things. - Plan: fluconazole (DIFLUCAN) 150 MG tablet   Total face-to-face time with patient: 15 minutes. Over 50% of encounter was spent on counseling and coordination of care. Return if symptoms worsen or fail to improve.Keep next scheduled appointment.  Reva Boresanya S Pratt 06/07/2017 2:43 PM

## 2017-06-12 ENCOUNTER — Ambulatory Visit: Payer: Self-pay | Admitting: Advanced Practice Midwife

## 2017-06-12 ENCOUNTER — Encounter: Payer: Self-pay | Admitting: Advanced Practice Midwife

## 2017-06-12 ENCOUNTER — Ambulatory Visit (INDEPENDENT_AMBULATORY_CARE_PROVIDER_SITE_OTHER): Payer: Self-pay | Admitting: Advanced Practice Midwife

## 2017-06-12 DIAGNOSIS — Z3049 Encounter for surveillance of other contraceptives: Secondary | ICD-10-CM

## 2017-06-12 DIAGNOSIS — O24439 Gestational diabetes mellitus in the puerperium, unspecified control: Secondary | ICD-10-CM

## 2017-06-12 DIAGNOSIS — Z30017 Encounter for initial prescription of implantable subdermal contraceptive: Secondary | ICD-10-CM

## 2017-06-12 LAB — POCT PREGNANCY, URINE: PREG TEST UR: NEGATIVE

## 2017-06-12 MED ORDER — ETONOGESTREL 68 MG ~~LOC~~ IMPL
68.0000 mg | DRUG_IMPLANT | Freq: Once | SUBCUTANEOUS | Status: AC
  Administered 2017-06-12: 68 mg via SUBCUTANEOUS

## 2017-06-12 NOTE — Addendum Note (Signed)
Addended by: Garret ReddishBARNES, Carlicia Leavens M on: 06/12/2017 12:02 PM   Modules accepted: Orders

## 2017-06-12 NOTE — Patient Instructions (Signed)
Elección del método anticonceptivo  (Contraception Choices)  La anticoncepción (control de la natalidad) es el uso de cualquier método o dispositivo para evitar el embarazo. A continuación se indican algunos de esos métodos.  ANTICONCEPTIVOS HORMONALES  · Un pequeño tubo colocado bajo la piel de la parte superior del brazo (implante). El tubo puede permanecer en el lugar durante 3 años. El implante debe quitarse después de 3 años.  · Inyecciones que se aplican cada 3 meses.  · Píldoras que deben tomarse todos los días.  · Parches que se cambian una vez por semana.  · Un anillo que se coloca en la vagina (anillo vaginal). El anillo se deja en su lugar durante 3 semanas y se retira durante 1 semana. Luego se coloca un nuevo anillo en la vagina.  · Píldoras para el control de la natalidad después de tener sexo (relaciones sexuales) sin protección.    ANTICONCEPTIVOS DE BARRERA  · Una cubierta delgada que se usa sobe el pene (condón masculino) que se coloca durante las relaciones sexuales.  · Una cubierta blanda y suelta que se coloca en la vagina (condón femenino) antes de las relaciones sexuales.  · Un dispositivo de goma que se aplica sobre el cuello del útero (diafragma). Este dispositivo debe ser hecho para usted. Se coloca en la vagina antes de tener relaciones sexuales. Debe dejarlo colocado en la vagina durante 6 a 8 horas después de las relaciones sexuales.  · Un capuchón pequeño y suave que se fija sobre el cuello del útero (capuchón cervical). Este capuchón debe ser hecho para usted. Debe dejarlo colocado en la vagina durante 48 horas después de las relaciones sexuales.  · Una esponja que se coloca en la vagina antes de tener relaciones sexuales.  · Una sustancia química que destruye o impide que los espermatozoides ingresen al cuello y al útero (espermicida). La sustancia química puede ser en crema, gel, espuma o píldoras.    DISPOSITIVO DE CONTROL INTRAUTERINO (DIU)   · El DIU es un pequeño dispositivo plástico en forma de T. Se coloca dentro del útero. Hay dos tipos de DIU:  ? DIU de cobre. El dispositivo está recubierto en alambre de cobre. El cobre produce un líquido que destruye los espermatozoides. Puede permanecer colocado durante 10 años.  ? DIU hormonal. La hormona impide que ocurra el embarazo. Puede permanecer colocado durante 5 años.    MÉTODOS PERMANENTES  · La mujer puede hacerse sellar, ligar u obstruir las trompas de Falopio durante una cirugía. Esto impide que el óvulo llegue hasta el útero.  · El médico coloca un alambre diminuto o lo inserta en cada una de las trompas de Falopio. Esto produce un tejido cicatrizal que obstruye las trompas de Falopio.  · En el hombre pueden ligarse los conductos por los que pasan los espermatozoides (vasectomía).    CONTROL DE LA NATALIDAD POR PLANIFICACIÓN FAMILIAR NATURAL  · La planificación familiar natural significa no tener relaciones sexuales o usar un método anticonceptivo de barrera en los períodos fértiles de la mujer.  · Use a un almanaque para llevar un registro de la extensión de cada período y para conocer los días en los que puede quedar embarazada.  · Evite tener relaciones sexuales durante la ovulación.  · Use un termómetro para medir la temperatura corporal. También reconozca los síntomas de la ovulación.  · El momento de tener relaciones sexuales debe ser después de que la mujer haya ovulado.  Use condones para protegerse de las enfermedades de transmisión   sexual (ETS). Hágalo independientemente del tipo de anticonceptivo que use. Hable con su médico acerca de cuál es el mejor método anticonceptivo para usted.  Esta información no tiene como fin reemplazar el consejo del médico. Asegúrese de hacerle al médico cualquier pregunta que tenga.  Document Released: 11/09/2010 Document Revised: 07/30/2013 Document Reviewed: 02/13/2013  Elsevier Interactive Patient Education © 2017 Elsevier Inc.

## 2017-06-12 NOTE — Progress Notes (Signed)
Subjective:     Yvonne Adams is a 36 y.o. female who presents for a postpartum visit. She is 5 weeks postpartum following a spontaneous vaginal delivery. I have fully reviewed the prenatal and intrapartum course. The delivery was at 39 gestational weeks. Outcome: spontaneous vaginal delivery. Anesthesia: epidural. Postpartum course has been normal. Baby's course has been normal. Baby is feeding by breast. Bleeding no bleeding. Bowel function is normal. Bladder function is normal. Patient is sexually active. Contraception method is Nexplanon. Postpartum depression screening: positive.  The following portions of the patient's history were reviewed and updated as appropriate: allergies, current medications, past family history, past medical history, past social history, past surgical history and problem list.  Review of Systems Pertinent items noted in HPI and remainder of comprehensive ROS otherwise negative.   Objective:    There were no vitals taken for this visit.  General:  alert, cooperative and no distress   Breasts:  inspection negative, no nipple discharge or bleeding, no masses or nodularity palpable  Lungs: clear to auscultation bilaterally  Heart:  regular rate and rhythm, S1, S2 normal, no murmur, click, rub or gallop  Abdomen: soft, non-tender; bowel sounds normal; no masses,  no organomegaly   Vulva:  not evaluated  Vagina: not evaluated  Cervix:  not evaluated  Corpus: not examined  Adnexa:  not evaluated  Rectal Exam: Not performed.        Nexplanon Insertion Procedure Patient identified, informed consent performed, consent signed.   Patient does understand that irregular bleeding is a very common side effect of this medication. She was advised to have backup contraception for one week after placement. Pregnancy test in clinic today was negative.  Appropriate time out taken.  Patient's left arm was prepped and draped in the usual sterile fashion. The ruler  used to measure and mark insertion area.  Patient was prepped with alcohol swab and then injected with 3 ml of 1% lidocaine.  She was prepped with betadine, Nexplanon removed from packaging,  Device confirmed in needle, then inserted full length of needle and withdrawn per handbook instructions. Nexplanon was able to palpated in the patient's arm; patient palpated the insert herself. There was minimal blood loss.  Patient insertion site covered with guaze and a pressure bandage to reduce any bruising.  The patient tolerated the procedure well and was given post procedure instructions.   Assessment:     Normal postpartum exam. Pap smear not done at today's visit.  Last pap 2016, normal.  Plan:    1. Contraception: Nexplanon 2. Follow up in: 1 year for pap smear and annual exam or as needed.

## 2017-06-13 LAB — GLUCOSE TOLERANCE, 2 HOURS
Glucose, 2 hour: 100 mg/dL (ref 65–139)
Glucose, GTT - Fasting: 92 mg/dL (ref 65–99)

## 2017-07-27 ENCOUNTER — Telehealth: Payer: Self-pay | Admitting: *Deleted

## 2017-08-10 NOTE — Telephone Encounter (Signed)
error 

## 2019-01-08 IMAGING — US US FETAL BPP W/ NON-STRESS
1 series · 13 of 13 positions shown · non-contrast
Comparison: none

[Series 1: us fetal bpp w/nonstress · 13 acquisitions, 13 frames shown]
[im 1/13]
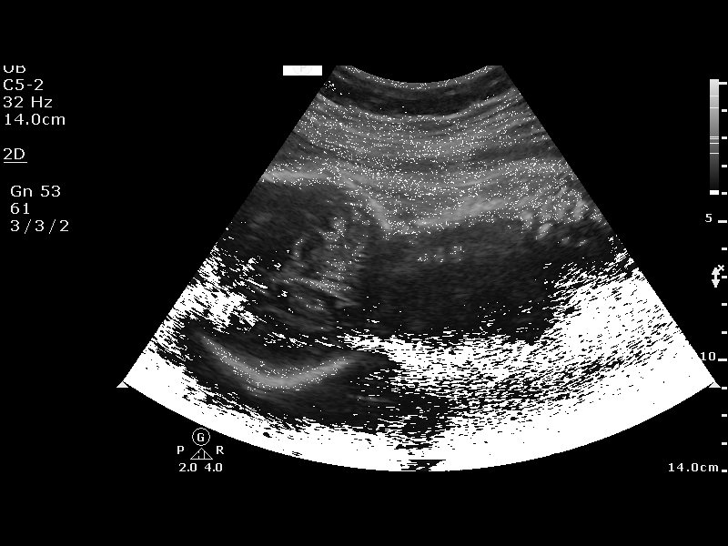
[im 2/13]
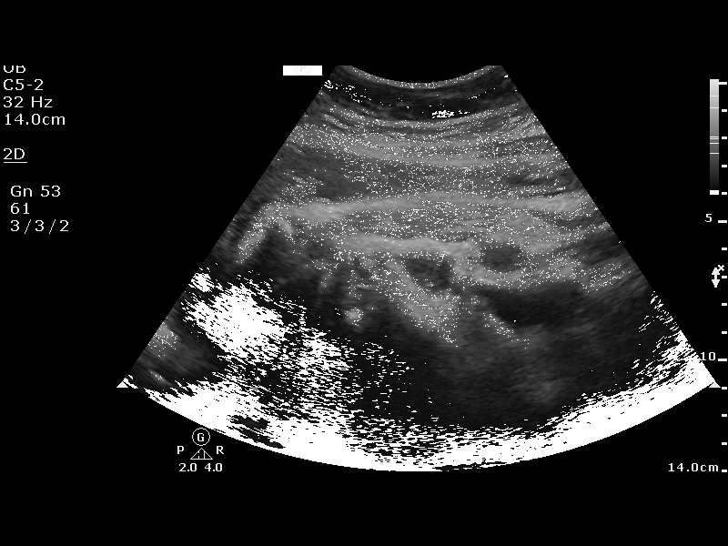
[im 3/13]
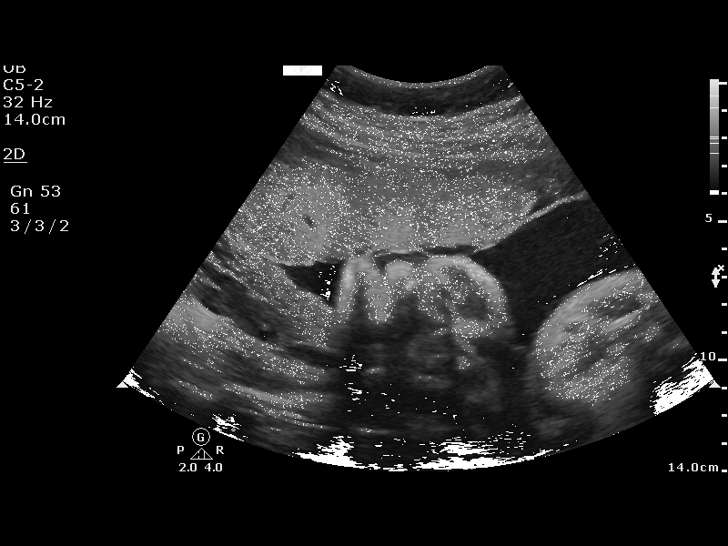
[im 4/13]
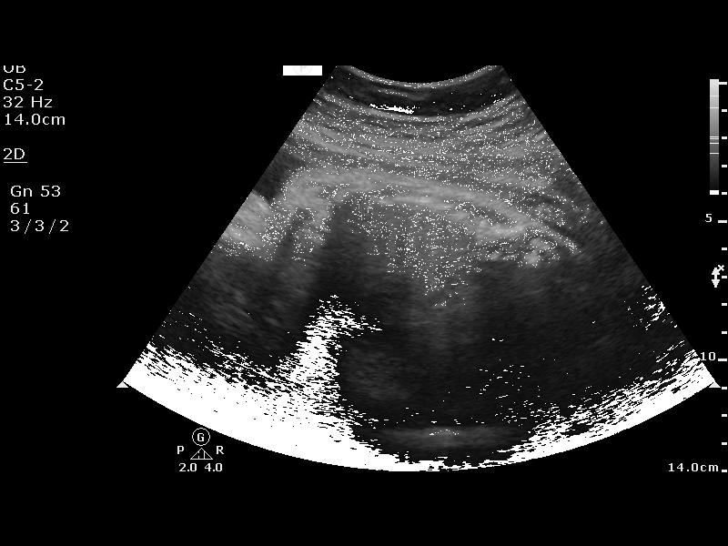
[im 5/13]
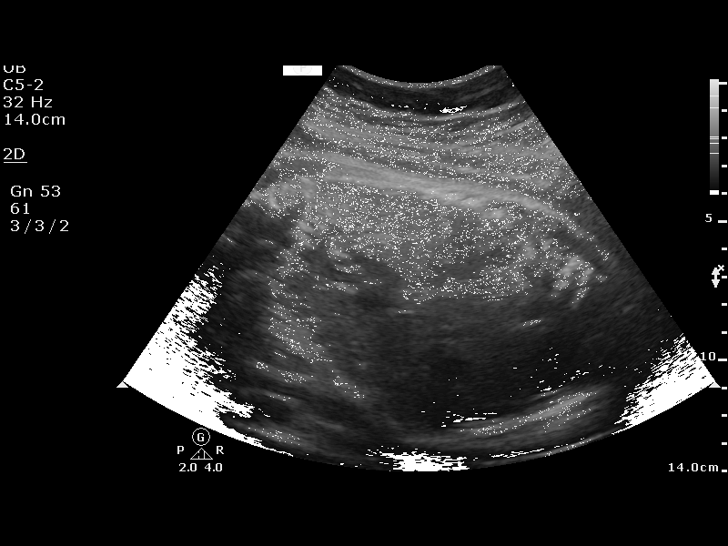
[im 6/13]
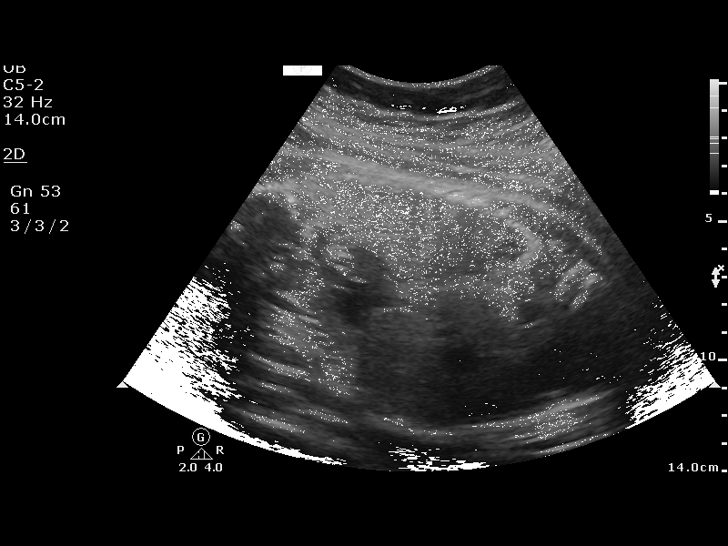
[im 7/13]
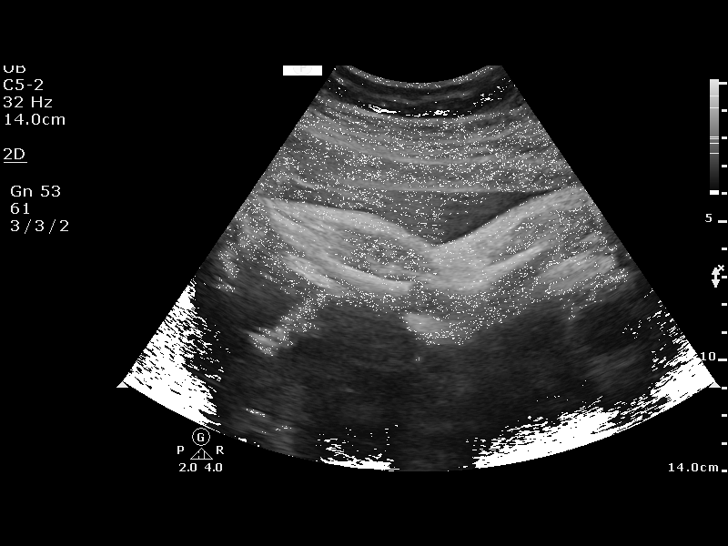
[im 8/13]
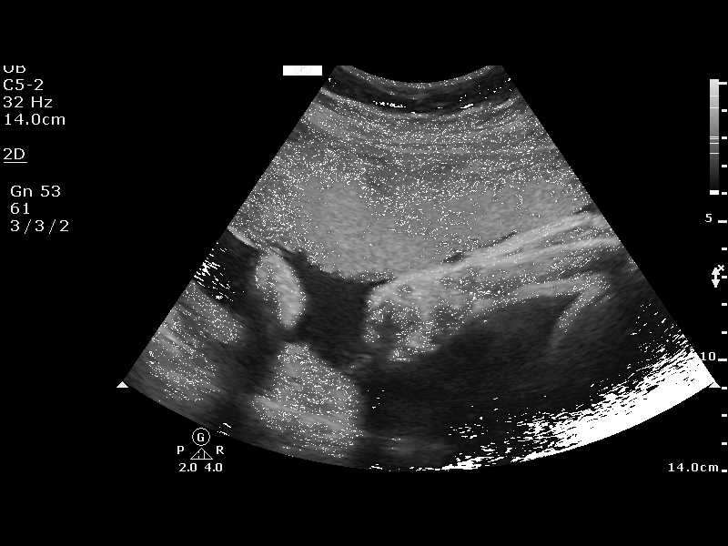
[im 9/13]
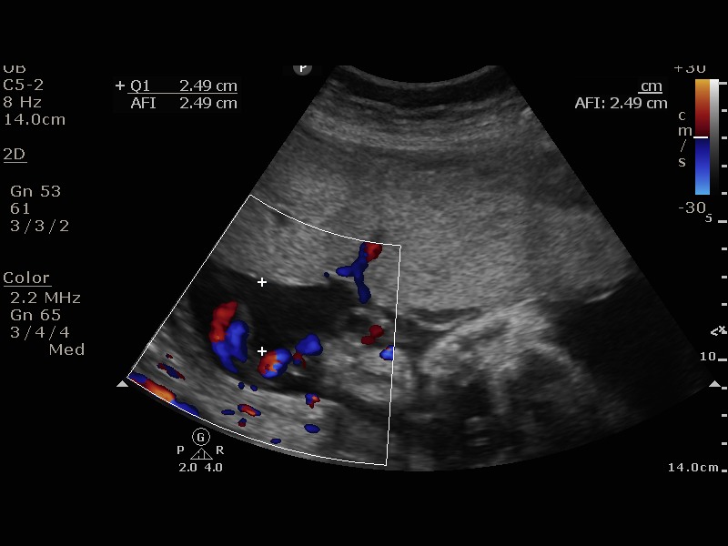
[im 10/13]
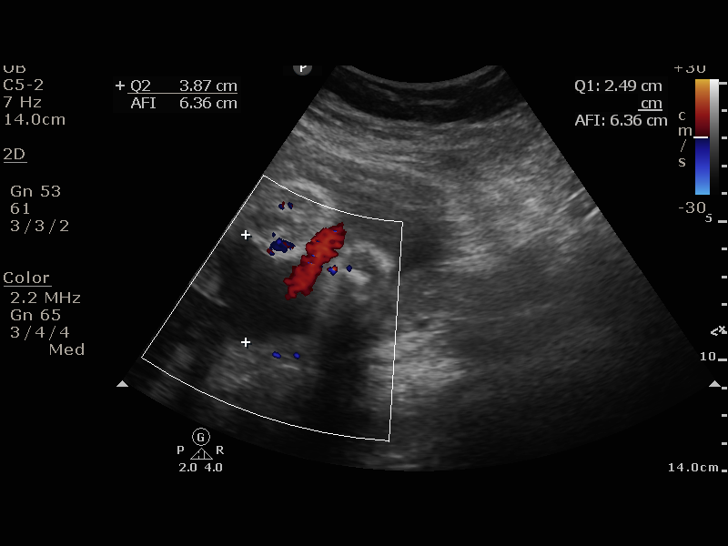
[im 11/13]
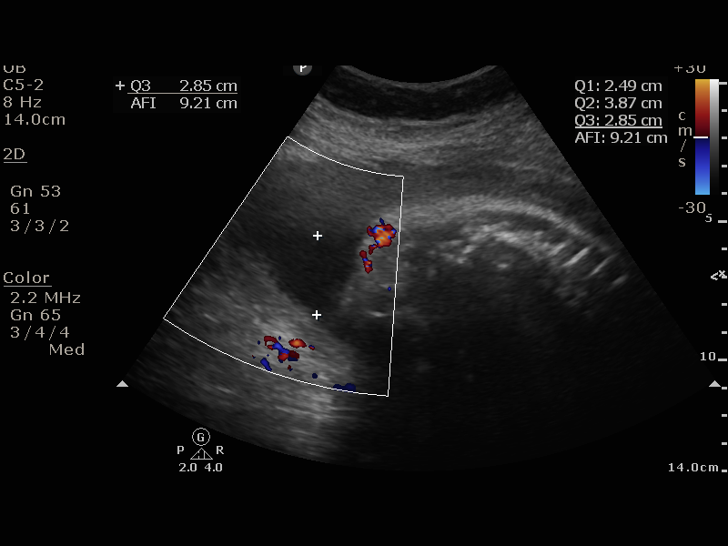
[im 12/13]
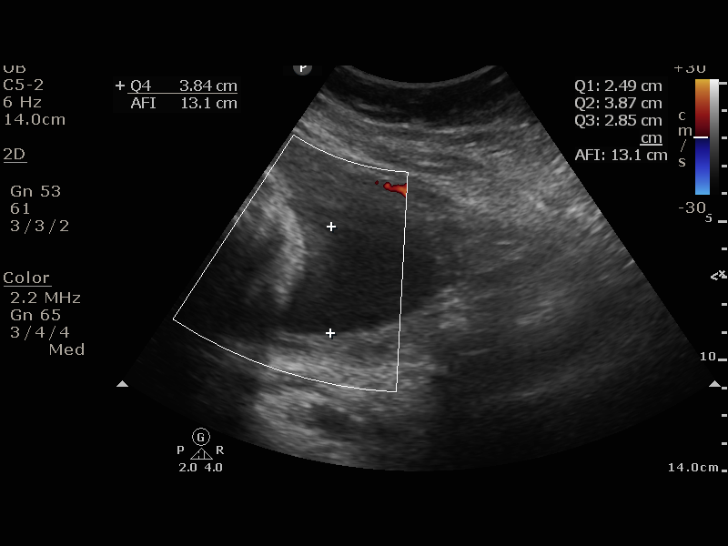
[im 13/13]
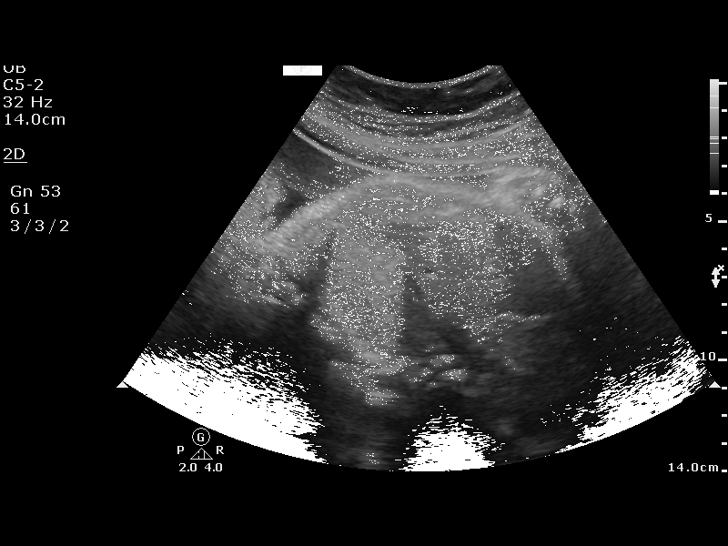

[13 of 13 positions shown; findings below may reference images not displayed]

[REDACTED]
Women's
[REDACTED]

1  US FETAL BPP W/NONSTRESS                    76818.4

1  AUAD             [id number]      [id number]     [id number]
Service(s) Provided

Indications

32 weeks gestation of pregnancy
Gestational diabetes in pregnancy,
unspecified control
OB History

Blood Type:            Height:  5'3"   Weight (lb):  180       BMI:
lb
Gravidity:    7         Term:   3        Prem:   0        SAB:   3
Living:       3
Fetal Evaluation

Num Of Fetuses:     1
Cardiac Activity:   Observed
Presentation:       Frank breech

Amniotic Fluid
AFI FV:      Subjectively within normal limits

AFI Sum(cm)     %Tile       Largest Pocket(cm)
13.05           40

RUQ(cm)       RLQ(cm)       LUQ(cm)        LLQ(cm)
2.49
Biophysical Evaluation

Amniotic F.V:   Within normal limits       F. Tone:        Observed
F. Movement:    Observed                   N.S.T:          Reactive
F. Breathing:   Observed                   Score:          [DATE]
Gestational Age

LMP:           32w 2d        Date:  07/30/16                 EDD:   05/06/17
Best:          32w 2d     Det. By:  LMP  (07/30/16)          EDD:   05/06/17
Impression

Normal amniotic fluid volume. BPP [DATE].
Recommendations

Continue recommended antenatal testing.

## 2019-02-08 IMAGING — US US FETAL BPP W/ NON-STRESS
1 series · 13 of 13 positions shown · non-contrast
Comparison: none

[Series 1: us fetal bpp w/nonstress · 13 acquisitions, 13 frames shown]
[im 1/13]
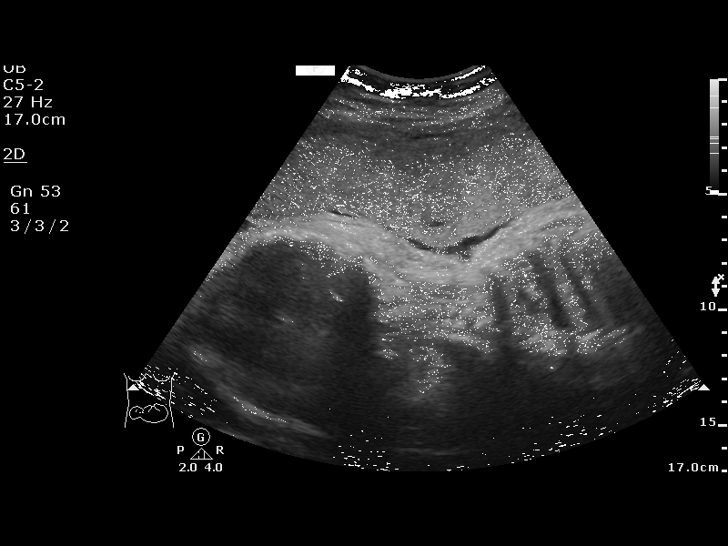
[im 2/13]
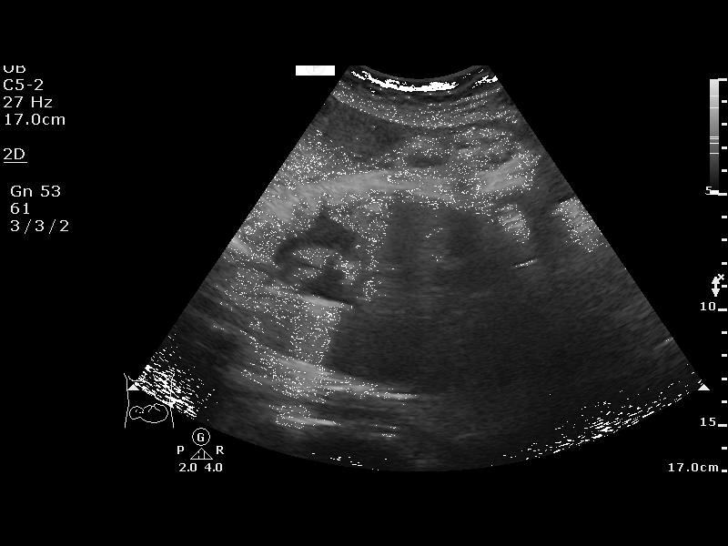
[im 3/13]
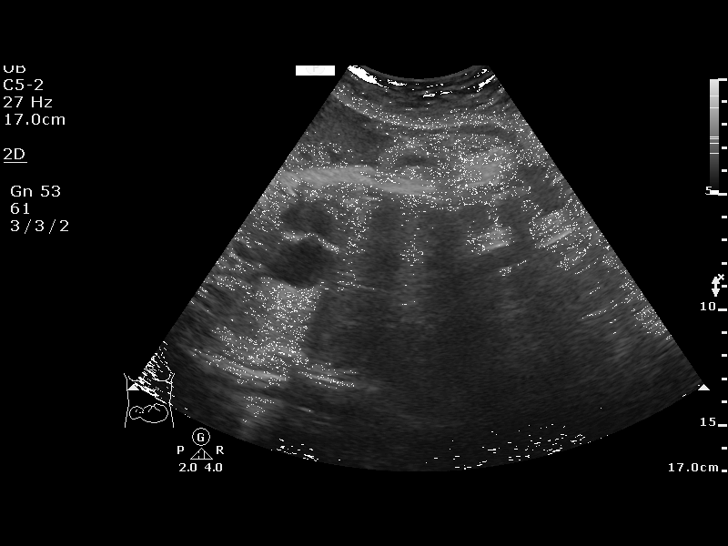
[im 4/13]
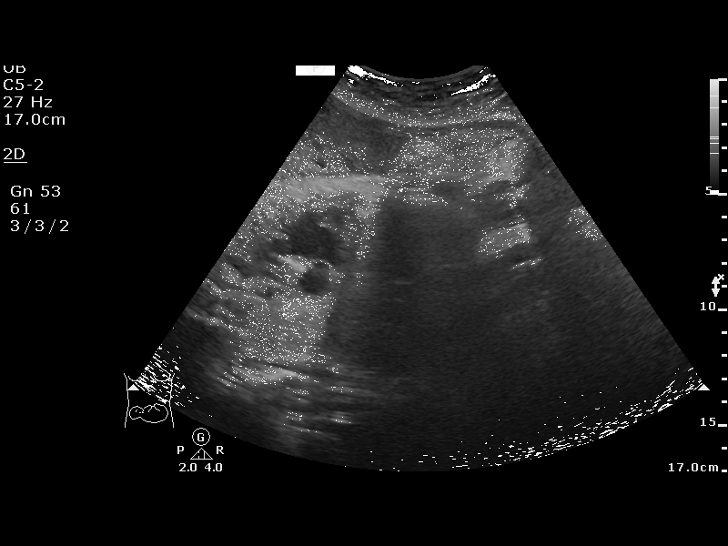
[im 5/13]
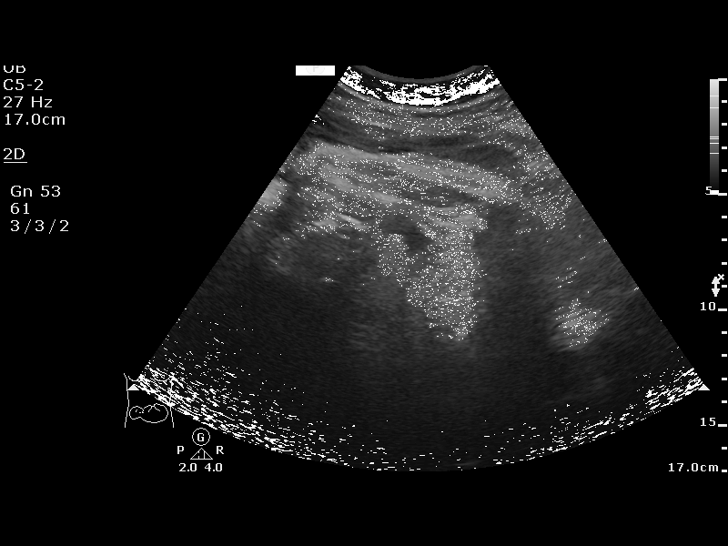
[im 6/13]
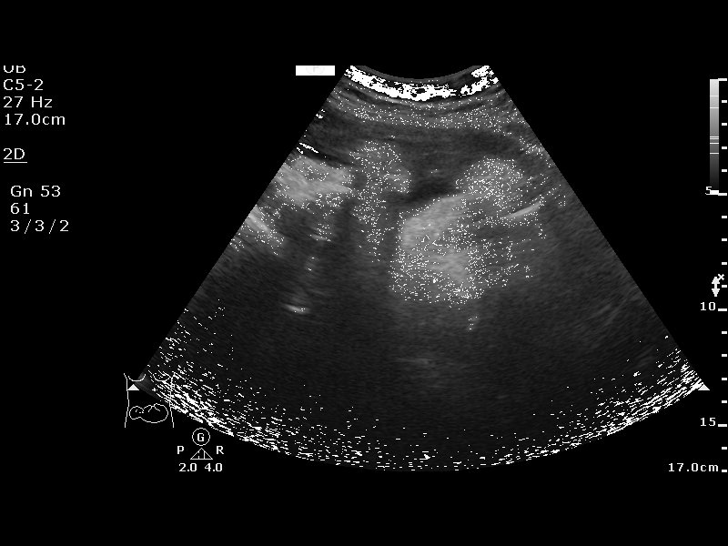
[im 7/13]
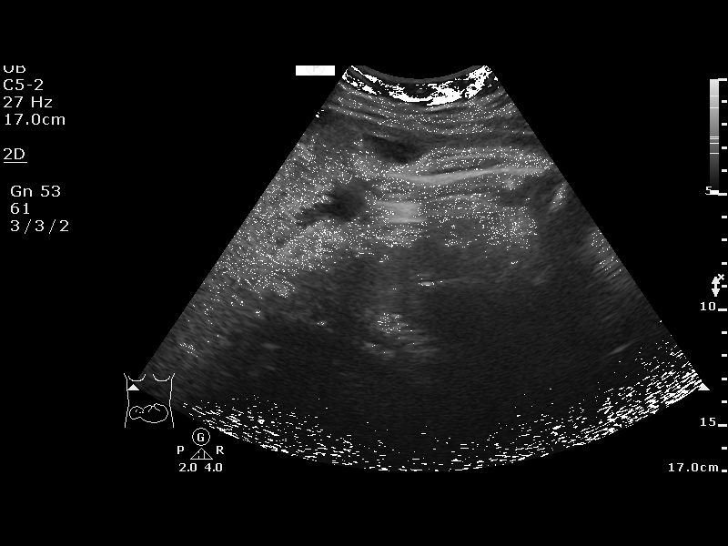
[im 8/13]
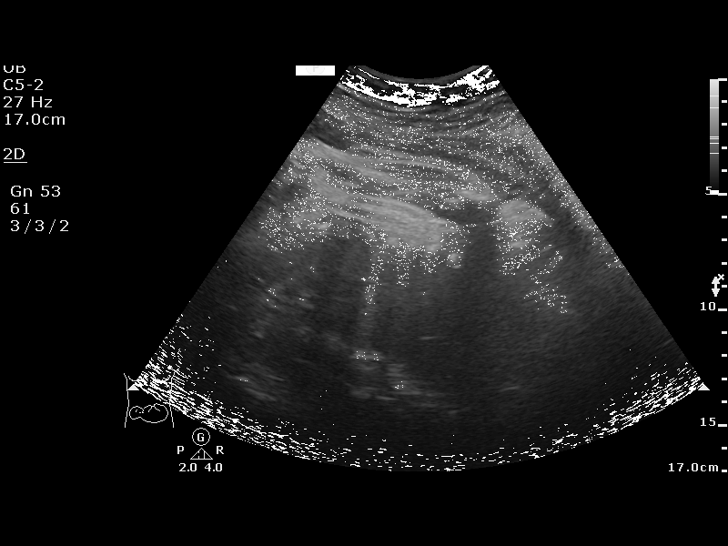
[im 9/13]
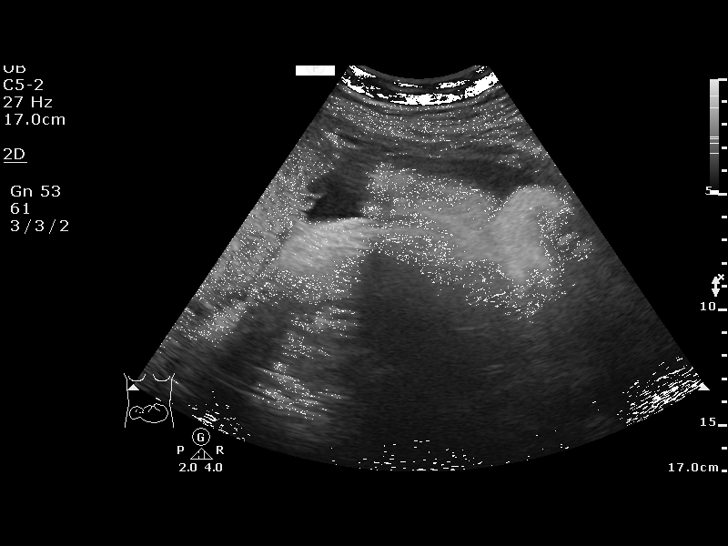
[im 10/13]
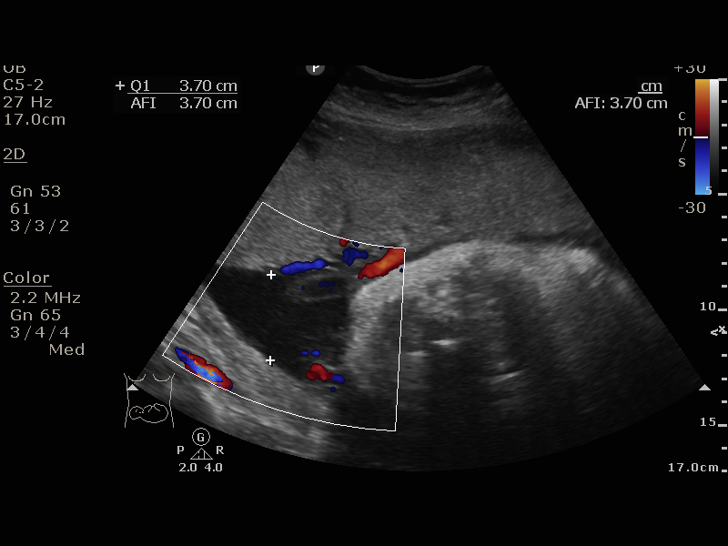
[im 11/13]
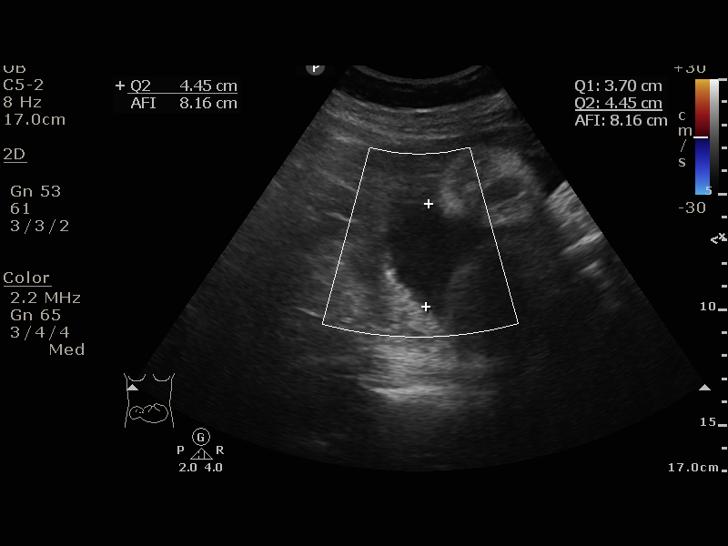
[im 12/13]
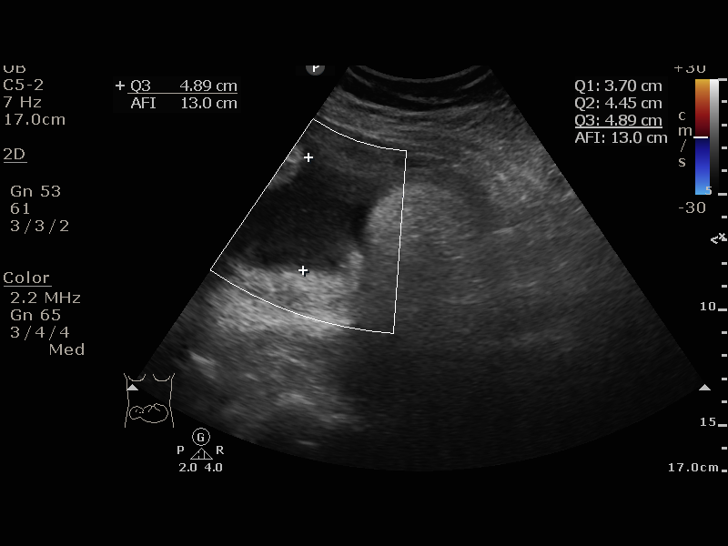
[im 13/13]
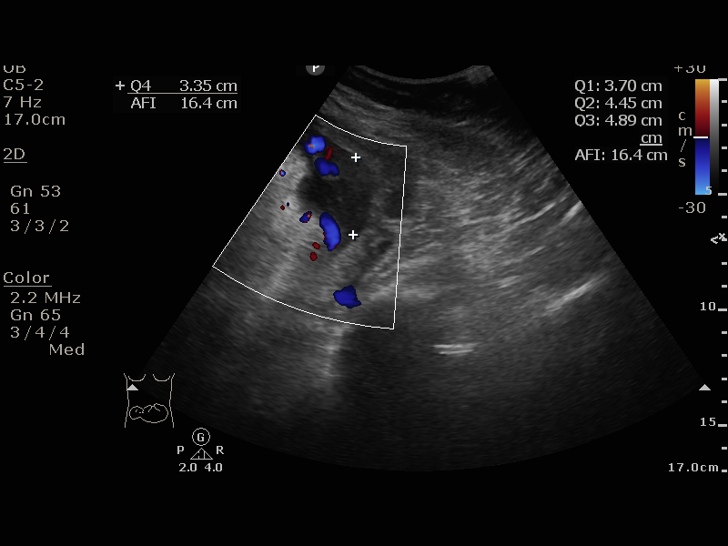

[13 of 13 positions shown; findings below may reference images not displayed]

[REDACTED]
Women's
[REDACTED]

1  US FETAL BPP W/NONSTRESS                    76818.4

Service(s) Provided

Indications

36 weeks gestation of pregnancy
Gestational diabetes in pregnancy,
controlled by oral hypoglycemic drugs
OB History

Blood Type:            Height:  5'3"   Weight (lb):  180       BMI:
lb
Gravidity:    7         Term:   3        Prem:   0        SAB:   3
Living:       3
Fetal Evaluation

Num Of Fetuses:     1
Preg. Location:     Intrauterine
Cardiac Activity:   Observed
Presentation:       Transverse, head to maternal right

Amniotic Fluid
AFI FV:      Subjectively within normal limits

AFI Sum(cm)     %Tile       Largest Pocket(cm)
16.39           62

RUQ(cm)       RLQ(cm)       LUQ(cm)        LLQ(cm)
3.7
Biophysical Evaluation

Amniotic F.V:   Pocket => 2 cm two         F. Tone:        Observed
planes
F. Movement:    Observed                   N.S.T:          Reactive
F. Breathing:   Observed                   Score:          [DATE]
Gestational Age

LMP:           36w 5d        Date:  07/30/16                 EDD:   05/06/17
Best:          36w 5d     Det. By:  LMP  (07/30/16)          EDD:   05/06/17
Impression

AFI 16
NST reactive
Recommendations

Continue antenatal testing.
SEMADI, DO

## 2022-12-09 ENCOUNTER — Other Ambulatory Visit: Payer: Self-pay | Admitting: Nurse Practitioner

## 2022-12-09 ENCOUNTER — Ambulatory Visit
Admission: RE | Admit: 2022-12-09 | Discharge: 2022-12-09 | Disposition: A | Discharge status: Home / Self Care | Payer: Self-pay | Source: Ambulatory Visit | Attending: Nurse Practitioner | Admitting: Nurse Practitioner

## 2022-12-09 DIAGNOSIS — M545 Low back pain, unspecified: Secondary | ICD-10-CM
